# Patient Record
Sex: Female | Born: 1985 | Race: Black or African American | Hispanic: No | Marital: Single | Smoking: Never smoker
Health system: Southern US, Community
[De-identification: ages and names within clinical notes are randomized; demographics above are authoritative.]

---
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
## 2014-03-29 ENCOUNTER — Other Ambulatory Visit: Payer: Self-pay | Admitting: Family Medicine

## 2014-03-29 ENCOUNTER — Ambulatory Visit (INDEPENDENT_AMBULATORY_CARE_PROVIDER_SITE_OTHER): Payer: No Typology Code available for payment source | Admitting: Family Medicine

## 2014-03-29 VITALS — BP 102/64 | HR 62 | Temp 98.4°F | Resp 16 | Ht 61.5 in | Wt 106.4 lb

## 2014-03-29 DIAGNOSIS — R1084 Generalized abdominal pain: Secondary | ICD-10-CM

## 2014-03-29 DIAGNOSIS — R110 Nausea: Secondary | ICD-10-CM

## 2014-03-29 DIAGNOSIS — R142 Eructation: Secondary | ICD-10-CM

## 2014-03-29 DIAGNOSIS — F418 Other specified anxiety disorders: Secondary | ICD-10-CM

## 2014-03-29 DIAGNOSIS — D649 Anemia, unspecified: Secondary | ICD-10-CM

## 2014-03-29 DIAGNOSIS — R141 Gas pain: Secondary | ICD-10-CM

## 2014-03-29 DIAGNOSIS — R143 Flatulence: Secondary | ICD-10-CM

## 2014-03-29 DIAGNOSIS — F411 Generalized anxiety disorder: Secondary | ICD-10-CM

## 2014-03-29 LAB — POCT URINALYSIS DIPSTICK
Bilirubin, UA: NEGATIVE
Glucose, UA: NEGATIVE
Ketones, UA: 15
Leukocytes, UA: NEGATIVE
Nitrite, UA: NEGATIVE
Protein, UA: NEGATIVE
Spec Grav, UA: >=1.03
Urobilinogen, UA: 0.2
pH, UA: 5.5

## 2014-03-29 LAB — POCT UA - MICROSCOPIC ONLY
Casts, Ur, LPF, POC: NEGATIVE
Crystals, Ur, HPF, POC: NEGATIVE
Yeast, UA: NEGATIVE

## 2014-03-29 LAB — POCT CBC
Granulocyte percent: 26.8 %G — AB (ref 37–80)
HCT, POC: 32.1 % — AB (ref 37.7–47.9)
Hemoglobin: 9.8 g/dL — AB (ref 12.2–16.2)
Lymph, poc: 3.9 — AB (ref 0.6–3.4)
MCH, POC: 21.9 pg — AB (ref 27–31.2)
MCHC: 30.5 g/dL — AB (ref 31.8–35.4)
MCV: 71.7 fL — AB (ref 80–97)
MID (cbc): 0.4 (ref 0–0.9)
MPV: 11.8 fL (ref 0–99.8)
POC Granulocyte: 1.6 — AB (ref 2–6.9)
POC LYMPH PERCENT: 67.1 %L — AB (ref 10–50)
POC MID %: 6.1 %M (ref 0–12)
Platelet Count, POC: 348 10*3/uL (ref 142–424)
RBC: 4.48 M/uL (ref 4.04–5.48)
RDW, POC: 20.7 %
WBC: 5.8 10*3/uL (ref 4.6–10.2)

## 2014-03-29 LAB — POCT URINE PREGNANCY: Preg Test, Ur: NEGATIVE

## 2014-03-29 MED ORDER — ESCITALOPRAM OXALATE 10 MG PO TABS: 10.0000 mg | ORAL_TABLET | Freq: Every day | ORAL | Status: DC

## 2014-03-29 MED ORDER — ALPRAZOLAM 0.25 MG PO TABS: 0.2500 mg | ORAL_TABLET | Freq: Three times a day (TID) | ORAL | Status: DC | PRN

## 2014-03-29 NOTE — Patient Instructions (Addendum)
Restart Lexapro - once per day and if needed for stress/anxiety - xanax up to 3 times per day for now. This medicine can make you sedated. Recheck with me in the next 2 weeks to discuss this further.   For counseling - call one of the following: [facility] PedClaire Huprich: [phone] Karmen BongoAaron Stewart: [phone]  Try to decrease mint and other foods below that may upset stomach.  You should receive a call or letter about your lab results within the next week to 10 days.  We will refer you for ultrasound of abdomen and referral to gastroenterologist.  Bring back the test for blood in the stool, and recheck in the next 1 week for repeat blood count - sooner here or to the emergency room if any lightheadedness, dizziness or worsening abdominal pain.   FOODS OR DRINKS TO AVOID OR LIMIT  Smoking or chewing tobacco. Nicotine is one of the most potent stimulants to acid production in the gastrointestinal tract.  Caffeinated and decaffeinated coffee and black tea.  Regular or low-calorie carbonated beverages or energy drinks (caffeine-free carbonated beverages are allowed).   Strong spices, such as black pepper, white pepper, red pepper, cayenne, curry powder, and chili powder.  Peppermint or spearmint.  Chocolate.  High-fat foods, including meats and fried foods. Extra added fats including oils, butter, salad dressings, and nuts. Limit these to less than 8 tsp per day.  Fruits and vegetables if they are not tolerated, such as citrus fruits or tomatoes.  Alcohol. Any food that seems to aggravate your condition.  Return to the clinic or go to the nearest emergency room if any of your symptoms worsen or new symptoms occur.

## 2014-03-29 NOTE — Progress Notes (Addendum)
Subjective:    Patient ID: Kathy Velazquez, female    DOB: Feb 25, 1986, 28 y.o.   MRN: [id number] This chart was scribed for Kathy StaggersJeffrey Shaterrica Territo, MD by Evon Slackerrance Branch, ED Scribe. This Patient was seen in room 01 and the patients care was started at 7:55 PM  HPI  Kathy Velazquez is a 28 y.o. female States she has been having an upset stomach for past 2 weeks. She states that's she has associated nausea, appetite change, unexpected weight change, dark green stool.   She states that generally her stomach is upset through out the day. She states that her pain is usually in her lower right quadrant. She states that she has been anxious lately due to problems at work with getting along with her co-workers. She states that she works at a pharmacy as a Associate Professorpharmacy tech. She states that she feels as if her co-workers were making fun of her. She states that at work she has problems with the gas and people commenting on the odor. Pt states that she has a possible h/o of being lactose intolerant which is when she felt similar symptoms due to diary products.  She states that she has previously seen Dr. Rico SheehanKeeler at [facility] about 1 month ago prescribed her Dexlansoprazole for the gas and take Levsin as needed. She that these prescribed medications have not relived her of her symptoms. Pt states that she was initially placed on Nexium with no relief. Pt denies having any blood work done during her visits with Dr. Rico SheehanKeeler. Pt denies emesis, fever, diarrhea, suicidal ideation, or depression. Pt also states that's 2 years prior she was prescribed Xanax for generalized anxiety. She states that she drink alcohol socially. She states that she was drinking more frequently, at least 1 alcoholic beverage several times a week. She states that she stopped drinking alcohol when her stomach symptoms initially started. She also states since the onset of her symptoms she has made a change to her diet. Pt states that she has started  eating healthier and stopped drinking diet sodas.   Pt States that she is single and not sexually active. She states her LNMP was 03/16/14.  Pt also states that she eats mints often.,    There are no active problems to display for this patient.  History reviewed. No pertinent past medical history. History reviewed. No pertinent past surgical history. Prior to Admission medications   Medication Sig Start Date End Date Taking? Authorizing Provider  Dexlansoprazole 30 MG capsule Take 30 mg by mouth daily.   Yes Historical Provider, MD  hyoscyamine (LEVSIN, ANASPAZ) 0.125 MG tablet Take 0.125 mg by mouth every 4 (four) hours as needed.   Yes Historical Provider, MD   No Known Allergies History   Social History  . Marital Status: Single    Spouse Name: N/A    Number of Children: N/A  . Years of Education: N/A   Occupational History  . Not on file.   Social History Main Topics  . Smoking status: Never Smoker   . Smokeless tobacco: Not on file  . Alcohol Use: Not on file  . Drug Use: Not on file  . Sexual Activity: Not on file   Other Topics Concern  . Not on file   Social History Narrative  . No narrative on file   Review of Systems  Constitutional: Positive for appetite change and unexpected weight change.  Gastrointestinal: Positive for nausea and abdominal pain. Negative for vomiting and diarrhea.  Genitourinary: Negative for dysuria and hematuria.  Skin: Negative for rash.  Psychiatric/Behavioral: Negative for suicidal ideas. The patient is nervous/anxious.    Objective:   Filed Vitals:   03/29/14 1920  BP: 102/64  Pulse: 62  Temp: 98.4 F (36.9 C)  TempSrc: Oral  Resp: 16  Height: 5' 1.5" (1.562 m)  Weight: 106 lb 6.4 oz (48.263 kg)  SpO2: 100%     Physical Exam  Nursing note and vitals reviewed. Constitutional: She is oriented to person, place, and time. She appears well-developed and well-nourished. No distress.  HENT:  Head: Normocephalic and  atraumatic.  Eyes: EOM are normal.  Neck: Neck supple. No tracheal deviation present.  Cardiovascular: Normal rate, regular rhythm and normal heart sounds.  Exam reveals no gallop and no friction rub.   No murmur heard. Pulmonary/Chest: Effort normal. No respiratory distress.  Abdominal: Soft. There is no tenderness. There is no tenderness at McBurney's point and negative Murphy's sign.  No CVA tenderness  Musculoskeletal: Normal range of motion.  Neurological: She is alert and oriented to person, place, and time.  Skin: Skin is warm and dry. No rash noted.  Psychiatric: Her speech is normal. Judgment normal. Her affect is blunt. She is withdrawn. Cognition and memory are normal. She expresses no suicidal ideation.  Flat affect, initial poor eye contact improved some towards end of visit, denied depressed mood, admitted to some anhedonia, denies safety concerns at home or work or abuse    Results for orders placed in visit on 03/29/14  POCT CBC      Result Value Ref Range   WBC 5.8  4.6 - 10.2 K/uL   Lymph, poc 3.9 (*) 0.6 - 3.4   POC LYMPH PERCENT 67.1 (*) 10 - 50 %L   MID (cbc) 0.4  0 - 0.9   POC MID % 6.1  0 - 12 %M   POC Granulocyte 1.6 (*) 2 - 6.9   Granulocyte percent 26.8 (*) 37 - 80 %G   RBC 4.48  4.04 - 5.48 M/uL   Hemoglobin 9.8 (*) 12.2 - 16.2 g/dL   HCT, POC 40.9 (*) 81.1 - 47.9 %   MCV 71.7 (*) 80 - 97 fL   MCH, POC 21.9 (*) 27 - 31.2 pg   MCHC 30.5 (*) 31.8 - 35.4 g/dL   RDW, POC 91.4     Platelet Count, POC 348  142 - 424 K/uL   MPV 11.8  0 - 99.8 fL  POCT URINE PREGNANCY      Result Value Ref Range   Preg Test, Ur Negative    POCT UA - MICROSCOPIC ONLY      Result Value Ref Range   WBC, Ur, HPF, POC 2-3     RBC, urine, microscopic 0-2     Bacteria, U Microscopic small     Mucus, UA small     Epithelial cells, urine per micros 2-4     Crystals, Ur, HPF, POC neg     Casts, Ur, LPF, POC neg     Yeast, UA neg    POCT URINALYSIS DIPSTICK      Result Value Ref  Range   Color, UA yellow     Clarity, UA clear     Glucose, UA neg     Bilirubin, UA neg     Ketones, UA 15     Spec Grav, UA >=1.030     Blood, UA trace     pH, UA 5.5  Protein, UA neg     Urobilinogen, UA 0.2     Nitrite, UA neg     Leukocytes, UA Negative      Assessment & Plan:   ELISABETH STROM is a 28 y.o. female Generalized abdominal pain , Nausea alone, Excessive gas - Plan: POCT CBC, POCT urine pregnancy, Lipase, Comprehensive metabolic panel, Ambulatory referral to Gastroenterology, US Abdomen Complete  -DDX of gastritis, functional gallbladder d/o, IBD, IBS, PUD  Will check labs above, check U/S of abdomen, and refer to GI.  Continue PPI for now, and avoid foods below for now.   Situational anxiety, Generalized anxiety disorder - Plan: ALPRAZolam (XANAX) 0.25 MG tablet, escitalopram (LEXAPRO) 10 MG tablet  - work based stress/anxiety with anxiety in past.  Suspect some secondary anxiety with GI distress and concern over this impact at work. Restart Lexapro, xanax tid prnor now and to call counselor to discuss work concerns.   Anemia - Plan: IFOBT POC (occult bld, rslt in office), IBC panel   - ? Hx in past. Asx. Check iron panel, repeat CBC in next week, check hemosure, rtc precautions    Meds ordered this encounter  Medications  . Dexlansoprazole 30 MG capsule    Sig: Take 30 mg by mouth daily.  . hyoscyamine (LEVSIN, ANASPAZ) 0.125 MG tablet    Sig: Take 0.125 mg by mouth every 4 (four) hours as needed.  . ALPRAZolam (XANAX) 0.25 MG tablet    Sig: Take 1 tablet (0.25 mg total) by mouth 3 (three) times daily as needed for anxiety.    Dispense:  20 tablet    Refill:  0  . escitalopram (LEXAPRO) 10 MG tablet    Sig: Take 1 tablet (10 mg total) by mouth daily.    Dispense:  30 tablet    Refill:  1   Patient Instructions  Restart Lexapro - once per day and if needed for stress/anxiety - xanax up to 3 times per day for now. This medicine can make you sedated.  Recheck with me in the next 2 weeks to discuss this further.   For counseling - call one of the following: [facility] Ped: [phone] Karmen Bongo: [phone]  Try to decrease mint and other foods below that may upset stomach.  You should receive a call or letter about your lab results within the next week to 10 days.  We will refer you for ultrasound of abdomen and referral to gastroenterologist.  Bring back the test for blood in the stool, and recheck in the next 1 week for repeat blood count - sooner here or to the emergency room if any lightheadedness, dizziness or worsening abdominal pain.   FOODS OR DRINKS TO AVOID OR LIMIT  Smoking or chewing tobacco. Nicotine is one of the most potent stimulants to acid production in the gastrointestinal tract.  Caffeinated and decaffeinated coffee and black tea.  Regular or low-calorie carbonated beverages or energy drinks (caffeine-free carbonated beverages are allowed).   Strong spices, such as black pepper, white pepper, red pepper, cayenne, curry powder, and chili powder.  Peppermint or spearmint.  Chocolate.  High-fat foods, including meats and fried foods. Extra added fats including oils, butter, salad dressings, and nuts. Limit these to less than 8 tsp per day.  Fruits and vegetables if they are not tolerated, such as citrus fruits or tomatoes.  Alcohol. Any food that seems to aggravate your condition.  Return to the clinic or go to the nearest emergency room if any of your  symptoms worsen or new symptoms occur.      I personally performed the services described in this documentation, which was scribed in my presence. The recorded information has been reviewed and considered, and addended by me as needed.

## 2014-03-30 LAB — LIPASE: Lipase: 10 U/L (ref 0–75)

## 2014-03-30 LAB — COMPREHENSIVE METABOLIC PANEL
ALT: 8 U/L (ref 0–35)
AST: 16 U/L (ref 0–37)
Albumin: 4.7 g/dL (ref 3.5–5.2)
Alkaline Phosphatase: 43 U/L (ref 39–117)
BUN: 6 mg/dL (ref 6–23)
CO2: 25 mEq/L (ref 19–32)
Calcium: 9.5 mg/dL (ref 8.4–10.5)
Chloride: 106 mEq/L (ref 96–112)
Creat: 0.57 mg/dL (ref 0.50–1.10)
Glucose, Bld: 80 mg/dL (ref 70–99)
Potassium: 4 mEq/L (ref 3.5–5.3)
Sodium: 137 mEq/L (ref 135–145)
Total Bilirubin: 0.7 mg/dL (ref 0.2–1.2)
Total Protein: 7.3 g/dL (ref 6.0–8.3)

## 2014-03-30 LAB — IRON: Iron: 13 ug/dL — ABNORMAL LOW (ref 42–145)

## 2014-03-30 LAB — IBC PANEL
%SAT: 3 % — ABNORMAL LOW (ref 20–55)
TIBC: 475 ug/dL — AB (ref 250–470)
UIBC: 462 ug/dL — ABNORMAL HIGH (ref 125–400)

## 2014-04-03 ENCOUNTER — Encounter: Payer: Self-pay | Admitting: Internal Medicine

## 2014-04-06 LAB — IFOBT (OCCULT BLOOD): IFOBT: POSITIVE

## 2014-04-06 NOTE — Addendum Note (Signed)
Addended by: Bronson CurbPOE, Chriselda Leppert C on: 04/06/2014 08:06 PM   Modules accepted: Orders

## 2014-04-11 ENCOUNTER — Ambulatory Visit (INDEPENDENT_AMBULATORY_CARE_PROVIDER_SITE_OTHER): Payer: No Typology Code available for payment source | Admitting: Family Medicine

## 2014-04-11 ENCOUNTER — Ambulatory Visit
Admission: RE | Admit: 2014-04-11 | Discharge: 2014-04-11 | Disposition: A | Discharge status: Home / Self Care | Payer: No Typology Code available for payment source | Source: Ambulatory Visit | Attending: Family Medicine | Admitting: Family Medicine

## 2014-04-11 VITALS — BP 110/70 | HR 87 | Temp 99.2°F | Resp 16 | Ht 63.0 in | Wt 103.0 lb

## 2014-04-11 DIAGNOSIS — F411 Generalized anxiety disorder: Secondary | ICD-10-CM

## 2014-04-11 DIAGNOSIS — R143 Flatulence: Secondary | ICD-10-CM

## 2014-04-11 DIAGNOSIS — R109 Unspecified abdominal pain: Secondary | ICD-10-CM

## 2014-04-11 DIAGNOSIS — D649 Anemia, unspecified: Secondary | ICD-10-CM

## 2014-04-11 DIAGNOSIS — R1084 Generalized abdominal pain: Secondary | ICD-10-CM

## 2014-04-11 DIAGNOSIS — R110 Nausea: Secondary | ICD-10-CM

## 2014-04-11 LAB — POCT CBC
Granulocyte percent: 36.4 %G — AB (ref 37–80)
HCT, POC: 33.8 % — AB (ref 37.7–47.9)
Hemoglobin: 10 g/dL — AB (ref 12.2–16.2)
Lymph, poc: 2.7 (ref 0.6–3.4)
MCH, POC: 21.4 pg — AB (ref 27–31.2)
MCHC: 29.6 g/dL — AB (ref 31.8–35.4)
MCV: 72.3 fL — AB (ref 80–97)
MID (CBC): 0.5 (ref 0–0.9)
POC Granulocyte: 1.8 — AB (ref 2–6.9)
POC LYMPH %: 54.3 % — AB (ref 10–50)
POC MID %: 9.3 % (ref 0–12)
Platelet Count, POC: 250 10*3/uL (ref 142–424)
RBC: 4.67 M/uL (ref 4.04–5.48)
RDW, POC: 19.2 %
WBC: 5 10*3/uL (ref 4.6–10.2)

## 2014-04-11 NOTE — Patient Instructions (Signed)
Hemoglobin stable.  Start iron over the counter (325mg ) once per day.  If abdominal pain starts to worsen again - return for recheck, but if continues to be improved - can follow up with gastroenterologist as scheduled. Call counselor as discussed - let me know if you need other names, and no other med changes for now.  Recheck in 3-4 weeks. Return to the clinic or go to the nearest emergency room if any of your symptoms worsen or new symptoms occur. Abdominal Pain, Adult Many things can cause abdominal pain. Usually, abdominal pain is not caused by a disease and will improve without treatment. It can often be observed and treated at home. Your health care provider will do a physical exam and possibly order blood tests and X-rays to help determine the seriousness of your pain. However, in many cases, more time must pass before a clear cause of the pain can be found. Before that point, your health care provider may not know if you need more testing or further treatment. HOME CARE INSTRUCTIONS  Monitor your abdominal pain for any changes. The following actions may help to alleviate any discomfort you are experiencing:  Only take over-the-counter or prescription medicines as directed by your health care provider.  Do not take laxatives unless directed to do so by your health care provider.  Try a clear liquid diet (broth, tea, or water) as directed by your health care provider. Slowly move to a bland diet as tolerated. SEEK MEDICAL CARE IF:  You have unexplained abdominal pain.  You have abdominal pain associated with nausea or diarrhea.  You have pain when you urinate or have a bowel movement.  You experience abdominal pain that wakes you in the night.  You have abdominal pain that is worsened or improved by eating food.  You have abdominal pain that is worsened with eating fatty foods. SEEK IMMEDIATE MEDICAL CARE IF:   Your pain does not go away within 2 hours.  You have a fever.  You  keep throwing up (vomiting).  Your pain is felt only in portions of the abdomen, such as the right side or the left lower portion of the abdomen.  You pass bloody or black tarry stools. MAKE SURE YOU:  Understand these instructions.   Will watch your condition.   Will get help right away if you are not doing well or get worse.  Document Released: 08/13/2005 Document Revised: 08/24/2013 Document Reviewed: 07/13/2013 [facility] Patient Information 2014 [place], [place].

## 2014-04-11 NOTE — Progress Notes (Addendum)
Subjective:    Patient ID: Kathy Velazquez, female    DOB: 1986/09/16, 28 y.o.   MRN: [id number] This chart was scribed for [name], MD by Valera Castle, ED Scribe. This patient was seen in room 08 and the patient's care was started at 1:00 PM.  Chief Complaint  Patient presents with  . Follow-up    abdominal pain   HPI Kathy Velazquez is a 28 y.o. female Follow up of abdominal pain. Initially seen 13 days ago. 2 weeks of abdomianl pain at that time. Had been seen b other providers prescribed Dexilant and Levsin as needed. Noted to be anemic with hemoglobin was 9.8 at last visit. Hemosure was positive and iron panel indicated iron deficiency She had complete abdominal US this morning. No acute abnormally noted. Lipase and CMP were normal. She was referred to GI appointment is 07/14. Also noted situational anxiety with GAD at last visit started Lexapro 10mg  a day and Xanax as needed. Phone was number given for counseling.   Pt reports feeling better in general. She reports her stomach was upset 2 days ago, but no issues since then. She reports still having gas issues, about the same amount as before with an associated sweet odor. She denies true abdominal cramping, but reports more of a sour feeling in her stomach.   She takes her Levsin usually once a day with lunch. She is taking a multivitamin daily that includes iron. Lexapro had given her dizzy spells at first, but she reports feeling better recently. She takes Xanax as needed, only 3 times since being prescribed.   She reports her work situation has changed. She talked to her supervisor, stating initially working after that was much worse, but lately it has improved significantly since then.  Her LNMP was 2 weeks ago. She states her periods will last 4-5 days with a moderate amount of bleeding, denies any menstrual changes.. She denies fever, vomiting, decreased appetite, black or tarry stool, dysuria, and any other associated  symptoms.  PCP - No PCP Per Patient  There are no active problems to display for this patient.  History reviewed. No pertinent past medical history. History reviewed. No pertinent past surgical history. No Known Allergies Prior to Admission medications   Medication Sig Start Date End Date Taking? Authorizing Provider  ALPRAZolam (XANAX) 0.25 MG tablet Take 1 tablet (0.25 mg total) by mouth 3 (three) times daily as needed for anxiety. 03/29/14  Yes Shade Flood, MD  Dexlansoprazole 30 MG capsule Take 30 mg by mouth daily.   Yes Historical Provider, MD  escitalopram (LEXAPRO) 10 MG tablet Take 1 tablet (10 mg total) by mouth daily. 03/29/14  Yes Shade Flood, MD  hyoscyamine (LEVSIN, ANASPAZ) 0.125 MG tablet Take 0.125 mg by mouth every 4 (four) hours as needed.   Yes Historical Provider, MD   Review of Systems  Constitutional: Negative for fever and appetite change.  Gastrointestinal: Positive for nausea and abdominal pain (sour feeling in stomach). Negative for vomiting and blood in stool.       Positive for gas with sweet odor.   Genitourinary: Negative for dysuria, difficulty urinating and menstrual problem.      Objective:   Physical Exam  Nursing note and vitals reviewed. Constitutional: She is oriented to person, place, and time. She appears well-developed and well-nourished. No distress.  HENT:  Head: Normocephalic and atraumatic.  Eyes: Conjunctivae and EOM are normal. Pupils are equal, round, and reactive to light.  Neck: Neck supple. Carotid bruit is not present.  Cardiovascular: Normal rate, regular rhythm, normal heart sounds and intact distal pulses.  Exam reveals no gallop and no friction rub.   No murmur heard. Pulmonary/Chest: Effort normal and breath sounds normal. No respiratory distress. She has no wheezes. She has no rales.  Abdominal: Soft. Bowel sounds are normal. She exhibits no distension and no pulsatile midline mass. There is no tenderness. There is no  rebound, no guarding and no CVA tenderness.  Musculoskeletal: Normal range of motion.  Neurological: She is alert and oriented to person, place, and time.  Skin: Skin is warm and dry.  Psychiatric: She has a normal mood and affect. Her behavior is normal.   BP 110/70  Pulse 87  Temp(Src) 99.2 F (37.3 C) (Oral)  Resp 16  Ht 5\' 3"  (1.6 m)  Wt 103 lb (46.72 kg)  BMI 18.25 kg/m2  SpO2 99%  LMP 03/30/2014  Results for orders placed in visit on 04/11/14  POCT CBC      Result Value Ref Range   WBC 5.0  4.6 - 10.2 K/uL   Lymph, poc 2.7  0.6 - 3.4   POC LYMPH PERCENT 54.3 (*) 10 - 50 %L   MID (cbc) 0.5  0 - 0.9   POC MID % 9.3  0 - 12 %M   POC Granulocyte 1.8 (*) 2 - 6.9   Granulocyte percent 36.4 (*) 37 - 80 %G   RBC 4.67  4.04 - 5.48 M/uL   Hemoglobin 10.0 (*) 12.2 - 16.2 g/dL   HCT, POC 16.1 (*) 09.6 - 47.9 %   MCV 72.3 (*) 80 - 97 fL   MCH, POC 21.4 (*) 27 - 31.2 pg   MCHC 29.6 (*) 31.8 - 35.4 g/dL   RDW, POC 04.5     Platelet Count, POC 250  142 - 424 K/uL   MPV    0 - 99.8 fL       Assessment & Plan:   Kathy Velazquez is a 28 y.o. female Pain in the abdomen , Anemia  - improved pain.  Stable anemia. DDX of gastritis/IBD/IBS, but heme positive stool - IBD possible.  Sx's improved, so can contineu same regimen with PPI, levsin if needed for now, start iron QD, and keep scheduled GI follow up, but if pain worsens again - rtc as may need CT or earlier GI eval.   GAD (generalized anxiety disorder)  -improved. Cont lexapro QD, xanax prn only. Call counselor as discussed. rtc for recheck in 1 month.    No orders of the defined types were placed in this encounter.   Patient Instructions  Hemoglobin stable.  Start iron over the counter (325mg ) once per day.  If abdominal pain starts to worsen again - return for recheck, but if continues to be improved - can follow up with gastroenterologist as scheduled. Call counselor as discussed - let me know if you need other names, and  no other med changes for now.  Recheck in 3-4 weeks. Return to the clinic or go to the nearest emergency room if any of your symptoms worsen or new symptoms occur. Abdominal Pain, Adult Many things can cause abdominal pain. Usually, abdominal pain is not caused by a disease and will improve without treatment. It can often be observed and treated at home. Your health care provider will do a physical exam and possibly order blood tests and X-rays to help determine the seriousness of your pain. However,  in many cases, more time must pass before a clear cause of the pain can be found. Before that point, your health care provider may not know if you need more testing or further treatment. HOME CARE INSTRUCTIONS  Monitor your abdominal pain for any changes. The following actions may help to alleviate any discomfort you are experiencing:  Only take over-the-counter or prescription medicines as directed by your health care provider.  Do not take laxatives unless directed to do so by your health care provider.  Try a clear liquid diet (broth, tea, or water) as directed by your health care provider. Slowly move to a bland diet as tolerated. SEEK MEDICAL CARE IF:  You have unexplained abdominal pain.  You have abdominal pain associated with nausea or diarrhea.  You have pain when you urinate or have a bowel movement.  You experience abdominal pain that wakes you in the night.  You have abdominal pain that is worsened or improved by eating food.  You have abdominal pain that is worsened with eating fatty foods. SEEK IMMEDIATE MEDICAL CARE IF:   Your pain does not go away within 2 hours.  You have a fever.  You keep throwing up (vomiting).  Your pain is felt only in portions of the abdomen, such as the right side or the left lower portion of the abdomen.  You pass bloody or black tarry stools. MAKE SURE YOU:  Understand these instructions.   Will watch your condition.   Will get help  right away if you are not doing well or get worse.  Document Released: 08/13/2005 Document Revised: 08/24/2013 Document Reviewed: 07/13/2013 [facility] Patient Information 2014 [place].        I personally performed the services described in this documentation, which was scribed in my presence. The recorded information has been reviewed and considered, and addended by me as needed.

## 2014-04-17 ENCOUNTER — Telehealth: Payer: Self-pay

## 2014-04-17 NOTE — Telephone Encounter (Signed)
Dexlansoprazole 30 MG capsule Pharmacy is needing pre authorization, or to use another drug that her insurance will cover

## 2014-04-18 NOTE — Telephone Encounter (Signed)
LMOM for pt to CB. Has she been Dxd w/GERD previously? What other medications has she tried for this previously?

## 2014-04-19 NOTE — Telephone Encounter (Signed)
LMOM to CB. 

## 2014-04-21 NOTE — Telephone Encounter (Signed)
Pt stated that she hasn't ever been told she has an actual Dx of GERD, but has an appt w/GI sch for July. She has previously tried Nexium, mylanta and pepto-Bismol. Dr Neva Seat can I use a Dx of GERD for prior auth, or just abdominal pain?

## 2014-04-26 NOTE — Telephone Encounter (Signed)
I do not have a diagnosis of GERD, just abd pain, and was improving as of last ov, but minimal change in sx's with dexilant initially as well.  Can try other PPI if easier to get this covered until seen by GI.  If worsening of pain - including any change or worsening on different PPI - will need to be seen sooner by GI.  If worsens - RTC.

## 2014-04-27 NOTE — Telephone Encounter (Signed)
Completed PA form on covermymeds. 

## 2014-05-02 NOTE — Telephone Encounter (Signed)
Can try omeprazole 20mg  qd, if not improved in 1-2 weeks, let me know. If worse, RTC.

## 2014-05-02 NOTE — Telephone Encounter (Signed)
Called [facility] had not heard decision and was advised PA denied bc pt has not tried/failed all of preferred: prilosec/omeprazole, prevacid and pantoprazole. Dr Neva SeatGreene, which would you like to Rx for pt (her GI appt is 05/30/14)

## 2014-05-04 MED ORDER — OMEPRAZOLE 20 MG PO CPDR: 20.0000 mg | DELAYED_RELEASE_CAPSULE | Freq: Every day | ORAL | Status: DC

## 2014-05-04 NOTE — Telephone Encounter (Signed)
[facility]

## 2014-05-04 NOTE — Addendum Note (Signed)
Addended by: Sheppard PlumberBRIGGS, BARBARA A on: 05/04/2014 03:35 PM   Modules accepted: Orders

## 2014-05-17 NOTE — Telephone Encounter (Signed)
Pt never CB so I called again and left details about new Rx and instr's from Dr Neva SeatGreene.

## 2014-05-30 ENCOUNTER — Ambulatory Visit: Payer: Self-pay | Admitting: Internal Medicine

## 2014-07-01 ENCOUNTER — Other Ambulatory Visit: Payer: Self-pay | Admitting: Family Medicine

## 2014-07-01 ENCOUNTER — Other Ambulatory Visit: Payer: Self-pay

## 2014-07-01 DIAGNOSIS — F418 Other specified anxiety disorders: Secondary | ICD-10-CM

## 2014-07-01 DIAGNOSIS — F411 Generalized anxiety disorder: Secondary | ICD-10-CM

## 2014-07-01 NOTE — Telephone Encounter (Signed)
The patient called about receiving refill authorization for escitalopram (LEXAPRO) 10 MG tablet and ALPRAZolam (XANAX) 0.25 MG tablet.  I informed her that she may need an office visit if those particular medications require a follow-up with a provider.  Please advise and contact the patient once the medication is sent to the pharmacy or if an appointment is needed. Thank you.  The patient's CB# is [phone].

## 2014-07-03 ENCOUNTER — Other Ambulatory Visit: Payer: Self-pay | Admitting: *Deleted

## 2014-07-03 MED ORDER — ALPRAZOLAM 0.25 MG PO TABS: 0.2500 mg | ORAL_TABLET | Freq: Three times a day (TID) | ORAL | Status: DC | PRN

## 2014-07-03 NOTE — Telephone Encounter (Signed)
Dr Neva SeatGreene, I gave pt 1 mos RF of Lexapro w/note to RTC for follow up for more RFs. Do you want to give a RF of alprazolam or need to see pt back first? Pended w/note to return

## 2014-07-03 NOTE — Telephone Encounter (Signed)
IN may - she was instructed to follow up in 1 month. Needs to return. Agree with refill Lexapro and xanax once -  until that ov (no further refills without OV).

## 2014-07-04 NOTE — Telephone Encounter (Signed)
Faxed

## 2014-07-31 ENCOUNTER — Ambulatory Visit (INDEPENDENT_AMBULATORY_CARE_PROVIDER_SITE_OTHER): Payer: No Typology Code available for payment source | Admitting: Emergency Medicine

## 2014-07-31 VITALS — BP 100/60 | HR 77 | Temp 98.3°F | Resp 16 | Ht 62.5 in | Wt 114.0 lb

## 2014-07-31 DIAGNOSIS — F411 Generalized anxiety disorder: Secondary | ICD-10-CM

## 2014-07-31 DIAGNOSIS — R196 Halitosis: Secondary | ICD-10-CM

## 2014-07-31 DIAGNOSIS — R6889 Other general symptoms and signs: Secondary | ICD-10-CM

## 2014-07-31 MED ORDER — ESCITALOPRAM OXALATE 20 MG PO TABS: 20.0000 mg | ORAL_TABLET | Freq: Every day | ORAL | Status: DC

## 2014-07-31 NOTE — Progress Notes (Addendum)
[facility] [address] [place] [address] [phone]  Date:  07/31/2014   Name:  Kathy Velazquez   DOB:  22-Jan-1986   MRN:  [id number]  PCP:  No PCP Per Patient    Chief Complaint: Sore Throat and Medication Problem   History of Present Illness:  Kathy Velazquez is a 28 y.o. very pleasant female patient who presents with the following:  Concerned that she may have an infection of either bacterial or fungal origin in her mouth associated with tongue coating. Has no cough or coryza, fever or chills. Has a sore throat with no difficulty swallowing.   Is under treatment for anxiety disorder with lexapro 10 since June and is concerned that it has not improved her symptoms of anxiety. She is eating and sleeping normally.  Has anxiety and palpitations at work where the majority of her symptoms arise.  No improvement with over the counter medications or other home remedies.  Denies other complaint or health concern today.   There are no active problems to display for this patient.   History reviewed. No pertinent past medical history.  History reviewed. No pertinent past surgical history.  History  Substance Use Topics  . Smoking status: Never Smoker   . Smokeless tobacco: Not on file  . Alcohol Use: Not on file    History reviewed. No pertinent family history.  No Known Allergies  Medication list has been reviewed and updated.  Current Outpatient Prescriptions on File Prior to Visit  Medication Sig Dispense Refill  . ALPRAZolam (XANAX) 0.25 MG tablet Take 1 tablet (0.25 mg total) by mouth 3 (three) times daily as needed for anxiety. PATIENT NEEDS OFFICE VISIT FOR ADDITIONAL REFILLS - overdue for follow up  20 tablet  0  . escitalopram (LEXAPRO) 10 MG tablet Take 1 tablet (10 mg total) by mouth daily. PATIENT NEEDS OFFICE VISIT FOR ADDITIONAL REFILLS - overdue for follow up  30 tablet  0  . Dexlansoprazole 30 MG capsule Take 30 mg by mouth daily.       . hyoscyamine (LEVSIN, ANASPAZ) 0.125 MG tablet Take 0.125 mg by mouth every 4 (four) hours as needed.      Marland Kitchen omeprazole (PRILOSEC) 20 MG capsule Take 1 capsule (20 mg total) by mouth daily.  30 capsule  0   No current facility-administered medications on file prior to visit.    Review of Systems:  As per HPI, otherwise negative.    Physical Examination: Filed Vitals:   07/31/14 1710  BP: 100/60  Pulse: 77  Temp: 98.3 F (36.8 C)  Resp: 16   Filed Vitals:   07/31/14 1710  Height: 5' 2.5" (1.588 m)  Weight: 114 lb (51.71 kg)   Body mass index is 20.51 kg/(m^2). Ideal Body Weight: Weight in (lb) to have BMI = 25: 138.6  GEN: WDWN, NAD, Non-toxic, A & O x 3 HEENT: Atraumatic, Normocephalic. Neck supple. No masses, No LAD. Ears and Nose: No external deformity. CV: RRR, No M/G/R. No JVD. No thrill. No extra heart sounds. PULM: CTA B, no wheezes, crackles, rhonchi. No retractions. No resp. distress. No accessory muscle use. ABD: S, NT, ND, +BS. No rebound. No HSM. EXTR: No c/c/e NEURO Normal gait.  PSYCH: Normally interactive. Conversant. Not depressed or anxious appearing.  Calm demeanor.    Assessment and Plan: Anxiety Halitosis Double dose lexapro and follow up in one month if no change   Signed,  Phillips Odor, MD

## 2014-07-31 NOTE — Patient Instructions (Signed)
Halitosis Halitosis is the medical term for bad breath. Halitosis may be caused by poor oral hygiene, dental problems, sinus infection, and foods like onion and garlic. Medications that make your mouth dry can also lead to bad breath. In children thumb sucking may also be a cause. Sour breath in the morning from reduced saliva flow is normal. This sour breath should go away after eating, drinking or brushing. Poor dental care from infrequent brushing and flossing is the most common reason for bad breath. Halitosis from tooth decay and gum or sinus infections requires ongoing treatment by a dentist or doctor. A dental cleaning and filling cavities may also be necessary.  Mild cases of halitosis will improve with good dental hygiene. Brush twice a day with fluoride toothpaste and use floss once a day. Ask your dentist or dental assistant to show you proper brushing and flossing technique. This is the most important part of treatment. Brushing your tongue may also help improve your breath. Mouth washes and breath mints offer only temporary benefit. Bad breath in children from thumb sucking will improve when they give up the habit.  See your caregiver or dentist if you need further dental care or have signs of an infection in your mouth. Document Released: 12/11/2004 Document Revised: 01/26/2012 Document Reviewed: 06/01/2009 ExitCare Patient Information 2015 [employer]. This information is not intended to replace advice given to you by your health care provider. Make sure you discuss any questions you have with your health care provider.  

## 2015-06-05 IMAGING — US US ABDOMEN COMPLETE
1 series · 14 of 25 positions shown · non-contrast
Comparison: None.

CLINICAL DATA: Right upper quadrant pain

EXAM:
ULTRASOUND ABDOMEN COMPLETE

[Series 1: us abdomen complete · 0.17mm/px · 14 of 77 slices shown]
[im 1/77]
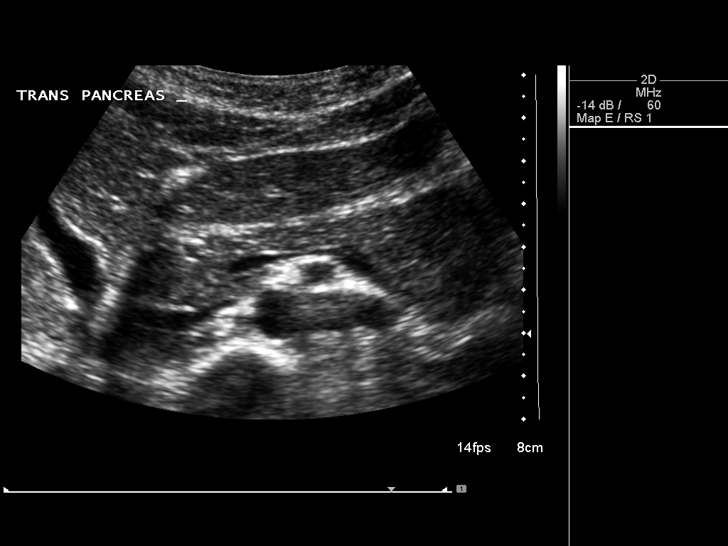
[im 7/77]
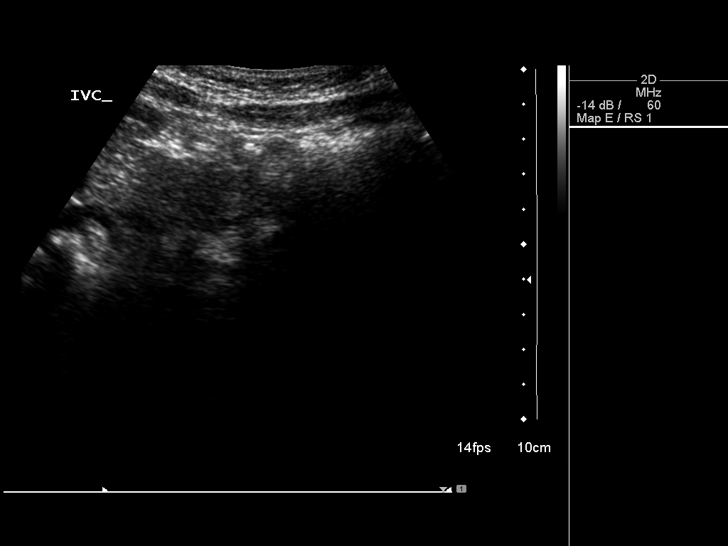
[im 13/77]
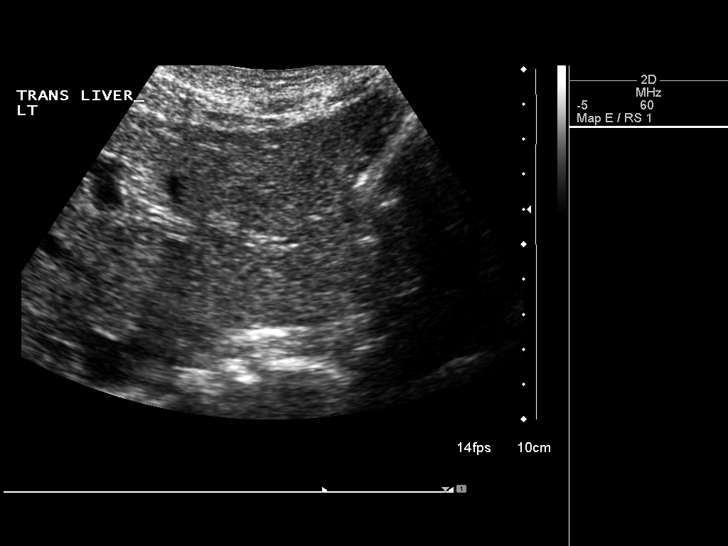
[im 20/77]
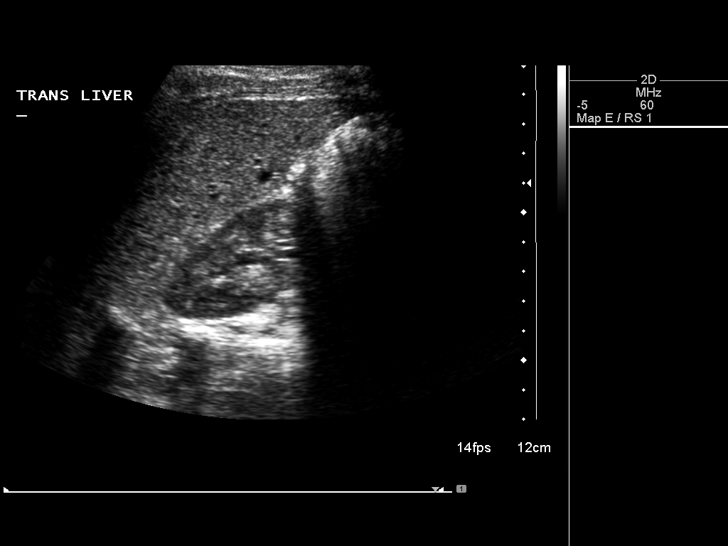
[im 26/77]
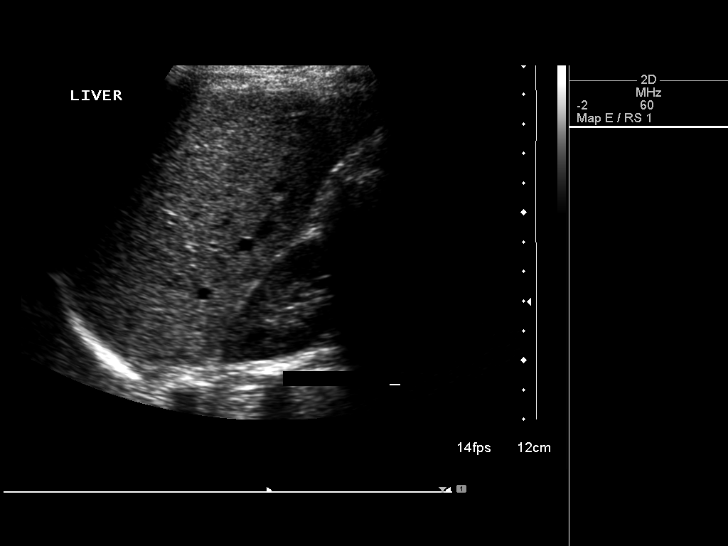
[im 29/77]
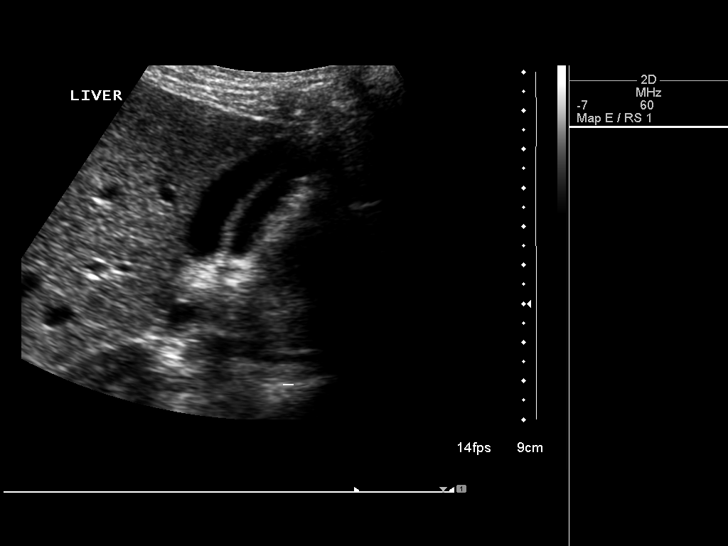
[im 35/77]
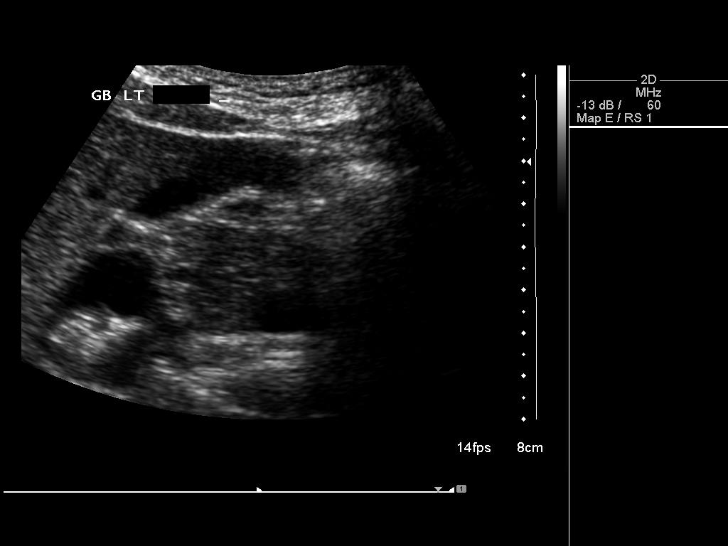
[im 42/77]
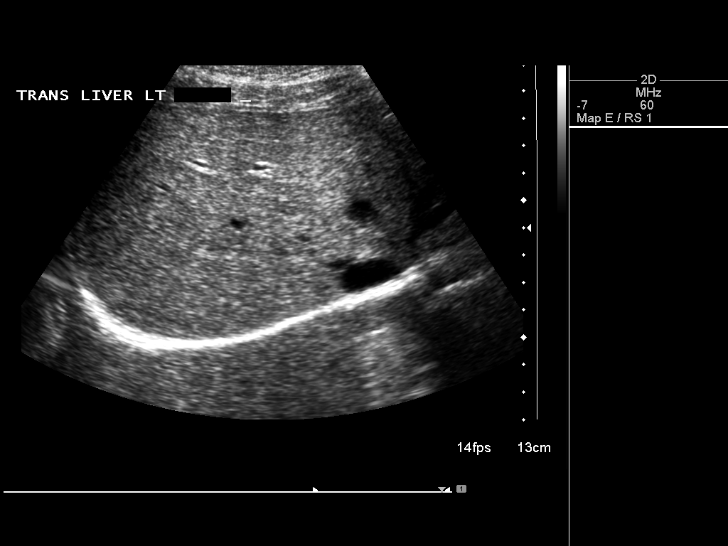
[im 48/77]
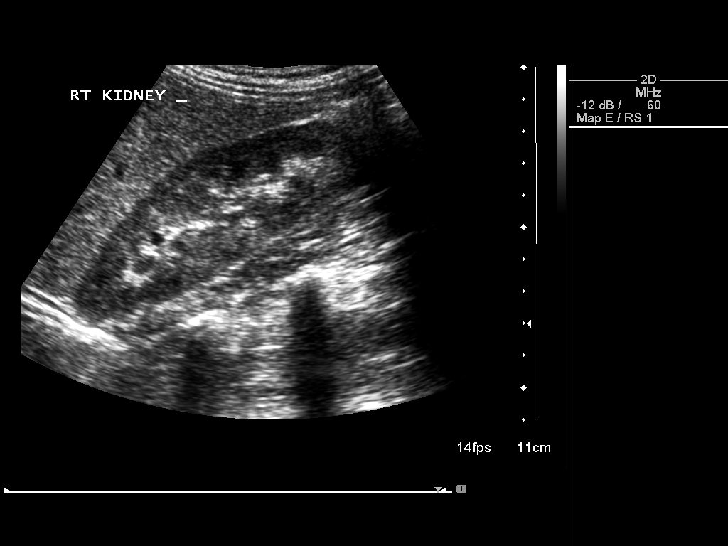
[im 51/77]
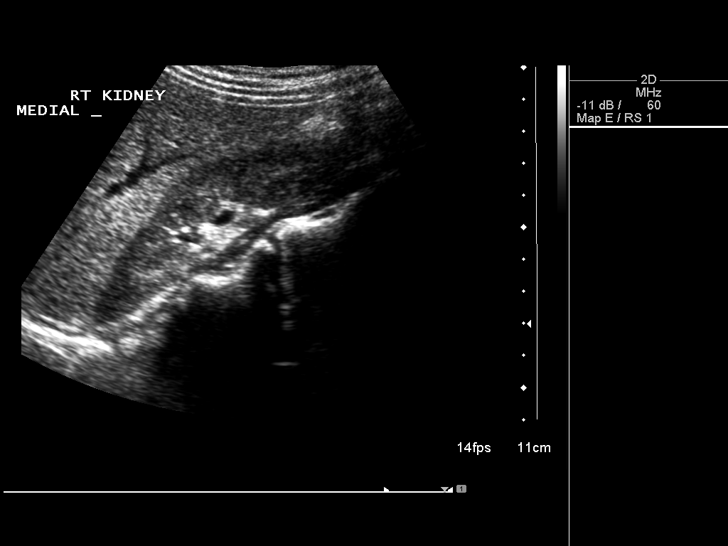
[im 58/77]
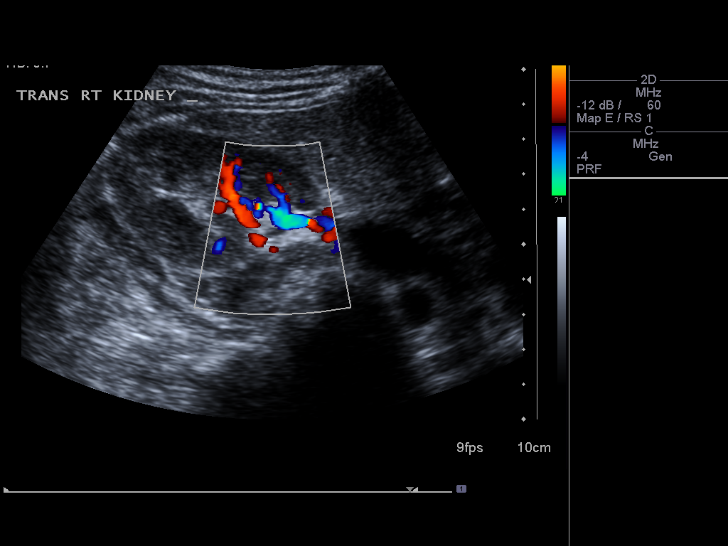
[im 64/77]
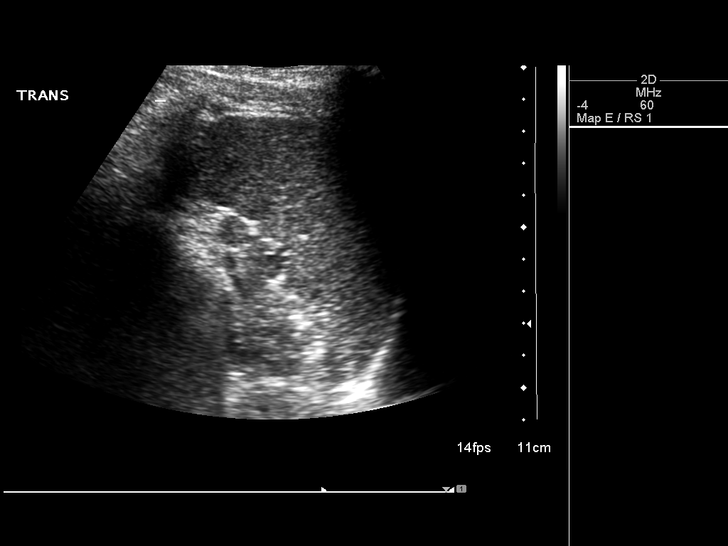
[im 70/77]
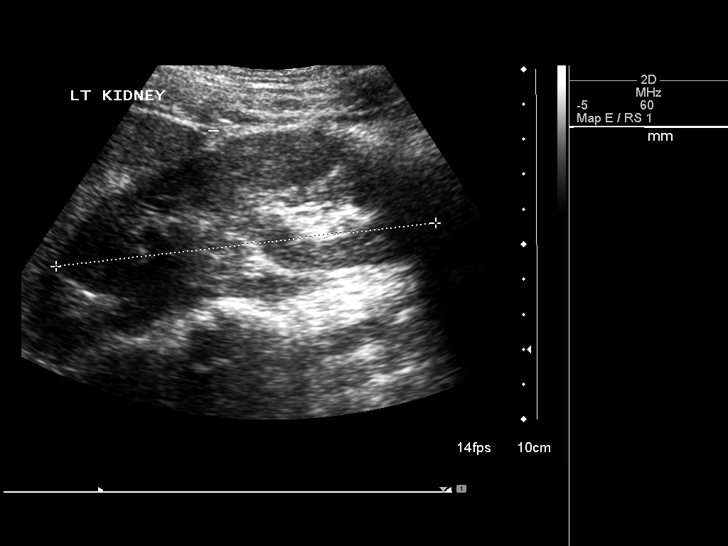
[im 77/77]
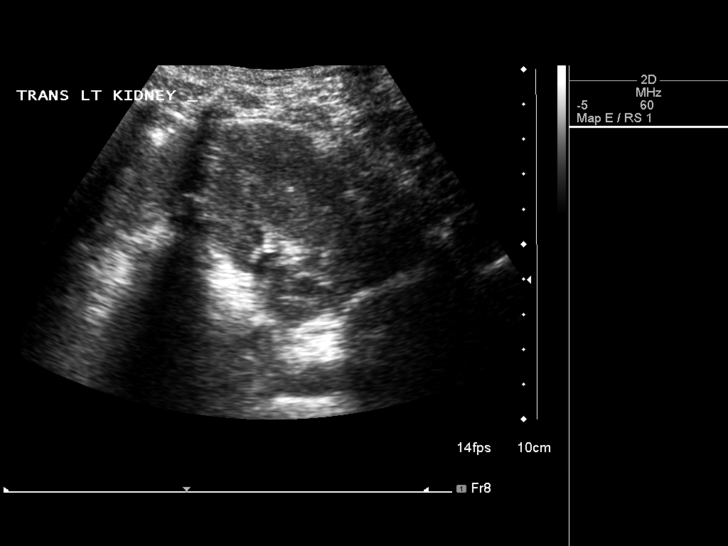

[14 of 25 positions shown; findings below may reference images not displayed]

FINDINGS: Gallbladder:

No gallstones or wall thickening visualized. No sonographic Murphy
sign noted.

Common bile duct:

Diameter: 2 mm

Liver:

No focal lesion identified. Within normal limits in parenchymal
echogenicity.

IVC:

No abnormality visualized.

Pancreas:

Visualized portion unremarkable.

Spleen:

Within normal limits although the small accessory splenule is noted
adjacent to the spleen.

Right Kidney:

Length: 10.9 cm.. Echogenicity within normal limits. No mass or
hydronephrosis visualized.

Left Kidney:

Length: 10.9 cm.. Echogenicity within normal limits. No mass or
hydronephrosis visualized.

Abdominal aorta:

No aneurysm visualized.

Other findings:

None.
IMPRESSION: No acute abnormality noted.

## 2016-02-11 ENCOUNTER — Ambulatory Visit (INDEPENDENT_AMBULATORY_CARE_PROVIDER_SITE_OTHER): Payer: BLUE CROSS/BLUE SHIELD | Admitting: Physician Assistant

## 2016-02-11 VITALS — BP 118/62 | HR 76 | Temp 99.4°F | Resp 16 | Ht 64.0 in | Wt 108.8 lb

## 2016-02-11 DIAGNOSIS — H6691 Otitis media, unspecified, right ear: Secondary | ICD-10-CM | POA: Diagnosis not present

## 2016-02-11 DIAGNOSIS — H6001 Abscess of right external ear: Secondary | ICD-10-CM

## 2016-02-11 MED ORDER — OFLOXACIN 0.3 % OT SOLN: 5.0000 [drp] | Freq: Every day | OTIC | Status: DC

## 2016-02-11 MED ORDER — ANTIPYRINE-BENZOCAINE 5.4-1.4 % OT SOLN: 3.0000 [drp] | OTIC | Status: DC | PRN

## 2016-02-11 MED ORDER — SULFAMETHOXAZOLE-TRIMETHOPRIM 800-160 MG PO TABS: 1.0000 | ORAL_TABLET | Freq: Two times a day (BID) | ORAL | Status: DC

## 2016-02-11 NOTE — Patient Instructions (Addendum)
     IF you received an x-ray today, you will receive an invoice from [facility]. Please contact [facility] at [phone] with questions or concerns regarding your invoice.   IF you received labwork today, you will receive an invoice from [facility]. Please contact [facility] at [phone] with questions or concerns regarding your invoice.   Our billing staff will not be able to assist you with questions regarding bills from these companies.  You will be contacted with the lab results as soon as they are available. The fastest way to get your results is to activate your My Chart account. Instructions are located on the last page of this paperwork. If you have not heard from us regarding the results in 2 weeks, please contact this office.   Please use medication as prescribed. I would like you to contact me if no relief in the next 48-72 hours, as we will refer to ear nose and throat doctor. You can take tylenol or ibuprofen for the pain. Place a warm compress at the ear to help draw the abscess out.    Abscess An abscess (boil or furuncle) is an infected area on or under the skin. This area is filled with yellowish-white fluid (pus) and other material (debris). HOME CARE   Only take medicines as told by your doctor.  If you were given antibiotic medicine, take it as directed. Finish the medicine even if you start to feel better.  If gauze is used, follow your doctor's directions for changing the gauze.  To avoid spreading the infection:  Keep your abscess covered with a bandage.  Wash your hands well.  Do not share personal care items, towels, or whirlpools with others.  Avoid skin contact with others.  Keep your skin and clothes clean around the abscess.  Keep all doctor visits as told. GET HELP RIGHT AWAY IF:   You have more pain, puffiness (swelling), or redness in the wound site.  You have more fluid or blood coming from  the wound site.  You have muscle aches, chills, or you feel sick.  You have a fever. MAKE SURE YOU:   Understand these instructions.  Will watch your condition.  Will get help right away if you are not doing well or get worse.   This information is not intended to replace advice given to you by your health care provider. Make sure you discuss any questions you have with your health care provider.   Document Released: 04/21/2008 Document Revised: 05/04/2012 Document Reviewed: 01/17/2012 Elsevier Interactive Patient Education [employer]2016 Elsevier Inc.

## 2016-02-13 NOTE — Progress Notes (Signed)
[facility] [address], [place] [address] [phone]  Date:  02/11/2016   Name:  Kathy Velazquez   DOB:  1986/09/16   MRN:  [id number]  PCP:  No PCP Per Patient   Chief Complaint  Patient presents with  . Ear Pain    x 1 week, right side, some bleeding   History of Present Illness:  Kathy Velazquez is a 30 y.o. female patient who presents to [facility] for right ear pain.  Right ear pain for 1 week that has progressively worsened.  She states that it is aching constantly.  She has muffled hearing from the right ear.  She has had a little bleeding from the tm.       There are no active problems to display for this patient.   History reviewed. No pertinent past medical history.  History reviewed. No pertinent past surgical history.  Social History  Substance Use Topics  . Smoking status: Never Smoker   . Smokeless tobacco: None  . Alcohol Use: None    History reviewed. No pertinent family history.  No Known Allergies  Medication list has been reviewed and updated.  Current Outpatient Prescriptions on File Prior to Visit  Medication Sig Dispense Refill  . escitalopram (LEXAPRO) 20 MG tablet Take 1 tablet (20 mg total) by mouth daily. 30 tablet 5  . ALPRAZolam (XANAX) 0.25 MG tablet Take 1 tablet (0.25 mg total) by mouth 3 (three) times daily as needed for anxiety. PATIENT NEEDS OFFICE VISIT FOR ADDITIONAL REFILLS - overdue for follow up (Patient not taking: Reported on 02/11/2016) 20 tablet 0  . Dexlansoprazole 30 MG capsule Take 30 mg by mouth daily. Reported on 02/11/2016    . hyoscyamine (LEVSIN, ANASPAZ) 0.125 MG tablet Take 0.125 mg by mouth every 4 (four) hours as needed. Reported on 02/11/2016    . omeprazole (PRILOSEC) 20 MG capsule Take 1 capsule (20 mg total) by mouth daily. (Patient not taking: Reported on 02/11/2016) 30 capsule 0   No current facility-administered medications on file prior to visit.    ROS ROS otherwise unremarkable  unless listed above.   Physical Examination: BP 118/62 mmHg  Pulse 76  Temp(Src) 99.4 F (37.4 C)  Resp 16  Ht  (1.626 m)  Wt 108 lb 12.8 oz (49.351 kg)  BMI 18.67 kg/m2 Ideal Body Weight: Weight in (lb) to have BMI = 25: 145.3  Physical Exam  Constitutional: She is oriented to person, place, and time. She appears well-developed and well-nourished. No distress.  HENT:  Head: Normocephalic and atraumatic.  Left Ear: External ear normal.  Right ear canal with erythema at 6'oclock region and appearance of furuncle.  The tm is dull and erythematous.    Eyes: Conjunctivae and EOM are normal. Pupils are equal, round, and reactive to light.  Cardiovascular: Normal rate.   Pulmonary/Chest: Effort normal. No respiratory distress.  Neurological: She is alert and oriented to person, place, and time.  Skin: She is not diaphoretic.  Psychiatric: She has a normal mood and affect. Her behavior is normal.     Assessment and Plan: Kathy Velazquez is a 30 y.o. female who is here today for cc of right ear pain. Will treat for both abscess and otitis media.  Will cover MRSA. Advised tylenol or ibuprofen.  Also advised external warm compresses.  1. Abscess of ear canal, right - sulfamethoxazole-trimethoprim (BACTRIM DS,SEPTRA DS) 800-160 MG tablet; Take 1 tablet by mouth 2 (two)  times daily.  Dispense: 20 tablet; Refill: 0 - ofloxacin (FLOXIN OTIC) 0.3 % otic solution; Place 5 drops into the right ear daily.  Dispense: 5 mL; Refill: 0 - antipyrine-benzocaine (AURALGAN) otic solution; Place 3-4 drops into the right ear every 2 (two) hours as needed for ear pain.  Dispense: 10 mL; Refill: 0  2. Subacute otitis media of right ear, recurrence not specified, unspecified otitis media type - antipyrine-benzocaine (AURALGAN) otic solution; Place 3-4 drops into the right ear every 2 (two) hours as needed for ear pain.  Dispense: 10 mL; Refill: 0   Trena PlattStephanie Jaclin Finks, PA-C [facility] 02/13/2016 9:26 PM

## 2016-02-15 ENCOUNTER — Encounter: Payer: Self-pay | Admitting: Physician Assistant

## 2016-02-18 ENCOUNTER — Telehealth: Payer: Self-pay

## 2016-02-18 NOTE — Telephone Encounter (Signed)
Unfortunately, there isn't an alternative (at least not that I am aware of). She can use OTC ibuprofen/acetaminophen for pain relief.  If the pain persists at this point, she needs to RTC for re-evaluation.

## 2016-02-18 NOTE — Telephone Encounter (Signed)
Pharm faxed note that the Auralgan otic is not available any longer and asks that you Rx something else.

## 2016-02-19 NOTE — Telephone Encounter (Signed)
Called pharm and LM with Chelle's message.

## 2020-12-24 ENCOUNTER — Encounter (HOSPITAL_COMMUNITY): Payer: Self-pay | Admitting: Behavioral Health

## 2020-12-24 ENCOUNTER — Other Ambulatory Visit: Payer: Self-pay

## 2020-12-24 ENCOUNTER — Ambulatory Visit (HOSPITAL_COMMUNITY)
Admission: EM | Admit: 2020-12-24 | Discharge: 2020-12-26 | Disposition: A | Discharge status: Other Acute Inpatient Hospital | Payer: No Payment, Other | Attending: Psychiatry | Admitting: Psychiatry

## 2020-12-24 DIAGNOSIS — F323 Major depressive disorder, single episode, severe with psychotic features: Secondary | ICD-10-CM

## 2020-12-24 DIAGNOSIS — F322 Major depressive disorder, single episode, severe without psychotic features: Secondary | ICD-10-CM | POA: Diagnosis not present

## 2020-12-24 DIAGNOSIS — Z8659 Personal history of other mental and behavioral disorders: Secondary | ICD-10-CM | POA: Diagnosis not present

## 2020-12-24 DIAGNOSIS — F1010 Alcohol abuse, uncomplicated: Secondary | ICD-10-CM | POA: Diagnosis not present

## 2020-12-24 DIAGNOSIS — Z20822 Contact with and (suspected) exposure to covid-19: Secondary | ICD-10-CM | POA: Insufficient documentation

## 2020-12-24 DIAGNOSIS — R4689 Other symptoms and signs involving appearance and behavior: Secondary | ICD-10-CM

## 2020-12-24 DIAGNOSIS — Z818 Family history of other mental and behavioral disorders: Secondary | ICD-10-CM | POA: Insufficient documentation

## 2020-12-24 LAB — POC SARS CORONAVIRUS 2 AG -  ED: SARS Coronavirus 2 Ag: NEGATIVE

## 2020-12-24 MED ORDER — MAGNESIUM HYDROXIDE 400 MG/5ML PO SUSP: 30.0000 mL | Freq: Every day | ORAL | Status: DC | PRN

## 2020-12-24 MED ORDER — HYDROXYZINE HCL 25 MG PO TABS: 25.0000 mg | ORAL_TABLET | Freq: Three times a day (TID) | ORAL | Status: DC | PRN

## 2020-12-24 MED ORDER — TRAZODONE HCL 50 MG PO TABS: 50.0000 mg | ORAL_TABLET | Freq: Every evening | ORAL | Status: DC | PRN

## 2020-12-24 MED ORDER — ALUM & MAG HYDROXIDE-SIMETH 200-200-20 MG/5ML PO SUSP: 30.0000 mL | ORAL | Status: DC | PRN

## 2020-12-24 MED ORDER — ACETAMINOPHEN 325 MG PO TABS: 650.0000 mg | ORAL_TABLET | Freq: Four times a day (QID) | ORAL | Status: DC | PRN

## 2020-12-24 NOTE — ED Notes (Signed)
Pt very resistant to procedures, refusing all lab tests to be performed.  "After much coaxing, COVID tests completed.

## 2020-12-24 NOTE — BH Assessment (Addendum)
Clinician attempted to make contact w/ pt's brother, who is the petitioner of pt's IVC, twice but he did not answer the phone; the voice mailbox was also full, so clinician was unable to leave a message requesting he return her call.  Oren Bracket, brother/petitioner: [phone]
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
## 2020-12-24 NOTE — ED Notes (Signed)
Patient refused to give urine sample
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
## 2020-12-25 LAB — RESP PANEL BY RT-PCR (FLU A&B, COVID) ARPGX2
Influenza A by PCR: NEGATIVE
Influenza B by PCR: NEGATIVE
SARS Coronavirus 2 by RT PCR: NEGATIVE

## 2020-12-25 LAB — POC SARS CORONAVIRUS 2 AG: SARS Coronavirus 2 Ag: NEGATIVE

## 2020-12-25 MED ORDER — LORAZEPAM 1 MG PO TABS: 1.0000 mg | ORAL_TABLET | Freq: Four times a day (QID) | ORAL | Status: DC | PRN

## 2020-12-25 MED ORDER — ONDANSETRON 4 MG PO TBDP: 4.0000 mg | ORAL_TABLET | Freq: Four times a day (QID) | ORAL | Status: DC | PRN

## 2020-12-25 MED ORDER — ARIPIPRAZOLE 2 MG PO TABS: 2.0000 mg | ORAL_TABLET | Freq: Every day | ORAL | Status: DC

## 2020-12-25 MED ORDER — LOPERAMIDE HCL 2 MG PO CAPS: 2.0000 mg | ORAL_CAPSULE | ORAL | Status: DC | PRN

## 2020-12-25 MED ORDER — ADULT MULTIVITAMIN W/MINERALS CH
1.0000 | ORAL_TABLET | Freq: Every day | ORAL | Status: DC
  Filled 2020-12-25: qty 1

## 2020-12-25 MED ORDER — FLUOXETINE HCL 10 MG PO CAPS: 10.0000 mg | ORAL_CAPSULE | Freq: Every day | ORAL | Status: DC

## 2020-12-25 NOTE — ED Notes (Signed)
Pt sleeping at present, no distress noted, monitoring for safety. 

## 2020-12-25 NOTE — ED Notes (Signed)
Pt sleeping in no acute distess

## 2020-12-25 NOTE — ED Notes (Addendum)
Pt requested to see IVC paperwork again, stating she does not know why she is being held at this facility.  RN explained to patient that she is IVC per the Magistrate and the Medical team is seeking placement for her for inpatient treatment.

## 2020-12-25 NOTE — ED Notes (Signed)
Pt refused all meds after multiple attempts. Stated she wasn't taking any medications

## 2020-12-25 NOTE — ED Notes (Signed)
Pt sleeping at present, no distress noted, pt is IVC, monitoring for safety.  Remains pending placement.

## 2020-12-25 NOTE — ED Notes (Signed)
Lunch given: PB&J, Gaylan Gerold, diet coke
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
## 2020-12-25 NOTE — ED Notes (Signed)
Pt denies SI/HI/AVH. Refused am medication. Limited interaction noted and pt display poor eye contact. Denies need for hospital stay and requesting to leave facility. Pt's mother called requesting update from provider. Left phone number. MD notified. Will continue to monitor for safety.

## 2020-12-25 NOTE — ED Notes (Addendum)
Pt sleeping in no acute distress. RR even and unlabored. Safety maintained. 

## 2020-12-26 ENCOUNTER — Inpatient Hospital Stay (HOSPITAL_COMMUNITY)
Admission: AD | Admit: 2020-12-26 | Discharge: 2021-01-02 | DRG: 885 | Disposition: A | Discharge status: Home / Self Care | Payer: Federal, State, Local not specified - Other | Source: Ambulatory Visit | Attending: Psychiatry | Admitting: Psychiatry

## 2020-12-26 ENCOUNTER — Telehealth: Payer: Self-pay

## 2020-12-26 ENCOUNTER — Other Ambulatory Visit: Payer: Self-pay

## 2020-12-26 ENCOUNTER — Encounter (HOSPITAL_COMMUNITY): Payer: Self-pay | Admitting: Psychiatry

## 2020-12-26 DIAGNOSIS — D509 Iron deficiency anemia, unspecified: Secondary | ICD-10-CM | POA: Diagnosis present

## 2020-12-26 DIAGNOSIS — F333 Major depressive disorder, recurrent, severe with psychotic symptoms: Secondary | ICD-10-CM | POA: Diagnosis present

## 2020-12-26 DIAGNOSIS — F10250 Alcohol dependence with alcohol-induced psychotic disorder with delusions: Secondary | ICD-10-CM

## 2020-12-26 DIAGNOSIS — Z20822 Contact with and (suspected) exposure to covid-19: Secondary | ICD-10-CM | POA: Diagnosis present

## 2020-12-26 DIAGNOSIS — E039 Hypothyroidism, unspecified: Secondary | ICD-10-CM | POA: Diagnosis present

## 2020-12-26 DIAGNOSIS — E876 Hypokalemia: Secondary | ICD-10-CM | POA: Diagnosis present

## 2020-12-26 DIAGNOSIS — N390 Urinary tract infection, site not specified: Secondary | ICD-10-CM | POA: Diagnosis present

## 2020-12-26 DIAGNOSIS — F23 Brief psychotic disorder: Secondary | ICD-10-CM

## 2020-12-26 MED ORDER — LORAZEPAM 1 MG PO TABS: 1.0000 mg | ORAL_TABLET | Freq: Four times a day (QID) | ORAL | Status: DC | PRN

## 2020-12-26 MED ORDER — ARIPIPRAZOLE 2 MG PO TABS: 2.0000 mg | ORAL_TABLET | Freq: Every day | ORAL | Status: DC

## 2020-12-26 MED ORDER — THIAMINE HCL 100 MG PO TABS
100.0000 mg | ORAL_TABLET | Freq: Every day | ORAL | Status: DC
  Administered 2020-12-27 – 2021-01-02 (×7): 100 mg via ORAL
  Filled 2020-12-26 (×9): qty 1

## 2020-12-26 MED ORDER — ADULT MULTIVITAMIN W/MINERALS CH
1.0000 | ORAL_TABLET | Freq: Every day | ORAL | Status: DC
  Administered 2020-12-27 – 2021-01-02 (×7): 1 via ORAL
  Filled 2020-12-26 (×10): qty 1

## 2020-12-26 MED ORDER — LOPERAMIDE HCL 2 MG PO CAPS: 2.0000 mg | ORAL_CAPSULE | ORAL | Status: DC | PRN

## 2020-12-26 MED ORDER — ACETAMINOPHEN 325 MG PO TABS: 650.0000 mg | ORAL_TABLET | Freq: Four times a day (QID) | ORAL | Status: DC | PRN

## 2020-12-26 MED ORDER — QUETIAPINE FUMARATE 100 MG PO TABS
100.0000 mg | ORAL_TABLET | Freq: Every day | ORAL | Status: DC
  Filled 2020-12-26 (×3): qty 1

## 2020-12-26 MED ORDER — HYDROXYZINE HCL 25 MG PO TABS
25.0000 mg | ORAL_TABLET | Freq: Three times a day (TID) | ORAL | Status: DC | PRN
  Filled 2020-12-26: qty 1

## 2020-12-26 MED ORDER — MAGNESIUM HYDROXIDE 400 MG/5ML PO SUSP: 30.0000 mL | Freq: Every day | ORAL | Status: DC | PRN

## 2020-12-26 MED ORDER — THIAMINE HCL 100 MG/ML IJ SOLN: 100.0000 mg | Freq: Once | INTRAMUSCULAR | Status: DC

## 2020-12-26 MED ORDER — HYDROXYZINE HCL 25 MG PO TABS: 25.0000 mg | ORAL_TABLET | Freq: Four times a day (QID) | ORAL | Status: DC | PRN

## 2020-12-26 MED ORDER — ONDANSETRON 4 MG PO TBDP: 4.0000 mg | ORAL_TABLET | Freq: Four times a day (QID) | ORAL | Status: DC | PRN

## 2020-12-26 MED ORDER — FLUOXETINE HCL 10 MG PO CAPS: 10.0000 mg | ORAL_CAPSULE | Freq: Every day | ORAL | 3 refills | Status: DC

## 2020-12-26 MED ORDER — ALUM & MAG HYDROXIDE-SIMETH 200-200-20 MG/5ML PO SUSP: 30.0000 mL | ORAL | Status: DC | PRN

## 2020-12-26 MED ORDER — FLUOXETINE HCL 10 MG PO CAPS
10.0000 mg | ORAL_CAPSULE | Freq: Every day | ORAL | Status: DC
  Filled 2020-12-26 (×2): qty 1

## 2020-12-26 MED ORDER — TRAZODONE HCL 50 MG PO TABS
50.0000 mg | ORAL_TABLET | Freq: Every evening | ORAL | Status: DC | PRN
  Administered 2020-12-31 – 2021-01-01 (×2): 50 mg via ORAL
  Filled 2020-12-26 (×2): qty 1

## 2020-12-26 MED ORDER — ARIPIPRAZOLE 2 MG PO TABS
2.0000 mg | ORAL_TABLET | Freq: Every day | ORAL | Status: DC
  Filled 2020-12-26 (×2): qty 1
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
## 2020-12-26 NOTE — Tx Team (Signed)
Initial Treatment Plan 12/26/2020 1:13 PM Kathy Velazquez WKM:[id number]    PATIENT STRESSORS: Marital or family conflict Medication change or noncompliance Substance abuse   PATIENT STRENGTHS: General fund of knowledge Physical Health   PATIENT IDENTIFIED PROBLEMS: Aggression towards family and property  Anxiety    "Have help with anxiety"  "Coping skills"             DISCHARGE CRITERIA:  Improved stabilization in mood, thinking, and/or behavior Need for constant or close observation no longer present Reduction of life-threatening or endangering symptoms to within safe limits Verbal commitment to aftercare and medication compliance  PRELIMINARY DISCHARGE PLAN: Outpatient therapy  PATIENT/FAMILY INVOLVEMENT: This treatment plan has been presented to and reviewed with the patient, Kathy Velazquez.  The patient and family have been given the opportunity to ask questions and make suggestions.  Levin Bacon, RN 12/26/2020, 1:13 PM

## 2020-12-26 NOTE — Progress Notes (Signed)
Pt still refusing meds, labs, and urine culture.
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
## 2020-12-26 NOTE — [facility] Group Notes (Signed)
[facility] LCSW Group Therapy  12/26/2020 2:13 PM  Participation Level:  Did Not Attend  Summary of Progress/Problems: Patient did not attend.  Kathy Velazquez A Brean Carberry 12/26/2020, 2:13 PM  

## 2020-12-26 NOTE — ED Notes (Signed)
Pt resting with eyes closed. Rise and fall of chest noted. No new issues noted at this time. Will continue to monitor for safety 

## 2020-12-26 NOTE — Progress Notes (Signed)
Pt accepted to [facility].   Dr. Jola Babinski is the attending provider.    Call report to [phone]    Zella Ball, LPN @ [facility] notified.     Pt is IVC.    Pt may be transported by [employer]   Pt scheduled  to arrive at [facility] AFTER 1000 AM  Signed:  Corky Crafts, MSW, [place], LCASA 12/26/2020 9:39 AM

## 2020-12-26 NOTE — Telephone Encounter (Signed)
Pt doesn't seem to have ICD, PPM or ILR not sure why phone call was sent to [place] in error

## 2020-12-26 NOTE — ED Notes (Addendum)
PT ACCEPTED TO [facility] PENDING BED ASSIGNMENT AFTER 8AM AS PER AC KIM BROOKS.  PT IS IVC AND WILL NEED TRANSPORT BY [employer].

## 2020-12-26 NOTE — ED Notes (Signed)
Pt ambulated to back sallyport to be transported to [facility]. No s/s pain, discomfort, or acute distress. IVC still in place at time of transport

## 2020-12-26 NOTE — ED Notes (Signed)
Pt refused medications after multiple attempts. Pt stated "i'm not taking any medications."
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
## 2020-12-26 NOTE — ED Notes (Signed)
Pt sleeping at present, no distress noted, monitoring for safety. 

## 2020-12-26 NOTE — Discharge Instructions (Addendum)
Discharge to [facility] 

## 2020-12-27 LAB — LIPID PANEL
Cholesterol: 175 mg/dL (ref 0–200)
HDL: 83 mg/dL (ref >40)
LDL Cholesterol: 82 mg/dL (ref 0–99)
Total CHOL/HDL Ratio: 2.1 RATIO
Triglycerides: 51 mg/dL (ref <150)
VLDL: 10 mg/dL (ref 0–40)

## 2020-12-27 LAB — COMPREHENSIVE METABOLIC PANEL
ALT: 11 U/L (ref 0–44)
AST: 18 U/L (ref 15–41)
Albumin: 5.1 g/dL — ABNORMAL HIGH (ref 3.5–5.0)
Alkaline Phosphatase: 60 U/L (ref 38–126)
Anion gap: 13 (ref 5–15)
BUN: 11 mg/dL (ref 6–20)
CO2: 23 mmol/L (ref 22–32)
Calcium: 9.6 mg/dL (ref 8.9–10.3)
Chloride: 107 mmol/L (ref 98–111)
Creatinine, Ser: 0.69 mg/dL (ref 0.44–1.00)
GFR, Estimated: >60 mL/min (ref >60)
Glucose, Bld: 92 mg/dL (ref 70–99)
Potassium: 3.1 mmol/L — ABNORMAL LOW (ref 3.5–5.1)
Sodium: 143 mmol/L (ref 135–145)
Total Bilirubin: 0.5 mg/dL (ref 0.3–1.2)
Total Protein: 8.2 g/dL — ABNORMAL HIGH (ref 6.5–8.1)

## 2020-12-27 LAB — CBC WITH DIFFERENTIAL/PLATELET
Abs Immature Granulocytes: 0.01 10*3/uL (ref 0.00–0.07)
Basophils Absolute: 0.1 10*3/uL (ref 0.0–0.1)
Basophils Relative: 1 %
Eosinophils Absolute: 0.1 10*3/uL (ref 0.0–0.5)
Eosinophils Relative: 1 %
HCT: 31.7 % — ABNORMAL LOW (ref 36.0–46.0)
Hemoglobin: 8.3 g/dL — ABNORMAL LOW (ref 12.0–15.0)
Immature Granulocytes: 0 %
Lymphocytes Relative: 50 %
Lymphs Abs: 3.1 10*3/uL (ref 0.7–4.0)
MCH: 17.7 pg — ABNORMAL LOW (ref 26.0–34.0)
MCHC: 26.2 g/dL — ABNORMAL LOW (ref 30.0–36.0)
MCV: 67.4 fL — ABNORMAL LOW (ref 80.0–100.0)
Monocytes Absolute: 0.7 10*3/uL (ref 0.1–1.0)
Monocytes Relative: 11 %
Neutro Abs: 2.3 10*3/uL (ref 1.7–7.7)
Neutrophils Relative %: 37 %
Platelets: 234 10*3/uL (ref 150–400)
RBC: 4.7 MIL/uL (ref 3.87–5.11)
RDW: 21.2 % — ABNORMAL HIGH (ref 11.5–15.5)
WBC: 6.2 10*3/uL (ref 4.0–10.5)
nRBC: 0 % (ref 0.0–0.2)

## 2020-12-27 LAB — TSH: TSH: 3.548 u[IU]/mL (ref 0.350–4.500)

## 2020-12-27 LAB — HEMOGLOBIN A1C
Hgb A1c MFr Bld: 5 % (ref 4.8–5.6)
Mean Plasma Glucose: 96.8 mg/dL

## 2020-12-27 MED ORDER — OLANZAPINE 10 MG IM SOLR: 5.0000 mg | Freq: Two times a day (BID) | INTRAMUSCULAR | Status: DC | PRN

## 2020-12-27 MED ORDER — OLANZAPINE 10 MG PO TBDP
10.0000 mg | ORAL_TABLET | Freq: Every day | ORAL | Status: DC
Start: 2020-12-27 — End: 2020-12-28
  Administered 2020-12-27: 10 mg via ORAL
  Filled 2020-12-27 (×2): qty 1

## 2020-12-27 MED ORDER — OLANZAPINE 5 MG PO TBDP
5.0000 mg | ORAL_TABLET | Freq: Every day | ORAL | Status: DC
  Administered 2020-12-27 – 2020-12-30 (×4): 5 mg via ORAL
  Filled 2020-12-27 (×7): qty 1

## 2020-12-27 NOTE — Progress Notes (Signed)
[facility] Second Physician Opinion Progress Note for Medication Administration to Non-consenting Patients (For Involuntarily Committed Patients)  Patient: COLTON ENGDAHL Date of Birth: [birth year] MRN: [id number]  Reason for the Medication: The patient, without the benefit of the specific treatment measure, is incapable of participating in any available treatment plan that will give the patient a realistic opportunity of improving the patient's condition. There is, without the benefit of the specific treatment measure, a significant possibility that the patient will harm self or others before improvement of the patient's condition is realized.  Consideration of Side Effects: Consideration of the side effects related to the medication plan has been given.  Rationale for Medication Administration: Patient will require medication to improve her condition. Her level of psychosis is such that she represents a danger to herself and others.    Clement Sayres, MD 12/27/20  11:13 AM   This documentation is good for (7) seven days from the date of the MD signature. New documentation must be completed every seven (7) days with detailed justification in the medical record if the patient requires continued non-emergent administration of psychotropic medications.

## 2020-12-27 NOTE — Progress Notes (Signed)
D: Patient presents with flat affect and is anxious at the time of assessment. Patient refused medication at time of assessment stating "the doctor didn't prescribe me any medication." RN offered reassurance and explanation of medication but patient ultimately refused. Patient denies SI/HI at this time. Patient also denies AH/VH at this time. Patient contracts for safety.  A: Provided positive reinforcement and encouragement. Provided medication education.  R: Patient not medication compliant at this time. Patient remains safe on the unit.   12/26/20 2036  Psych Admission Type (Psych Patients Only)  Admission Status Involuntary  Psychosocial Assessment  Patient Complaints Anxiety  Eye Contact Avoids  Facial Expression Blank;Flat  Affect Flat  Speech Logical/coherent;Soft  Interaction Minimal;Guarded;Avoidant;Cautious  Motor Activity Other (Comment) (WDL)  Appearance/Hygiene Unremarkable  Behavior Characteristics Cooperative;Anxious  Mood Anxious  Thought Process  Coherency WDL  Content Paranoia  Delusions Paranoid  Perception WDL  Hallucination None reported or observed  Judgment Poor  Confusion Mild  Danger to Self  Current suicidal ideation? Denies  Danger to Others  Danger to Others None reported or observed

## 2020-12-27 NOTE — [facility] Suicide Risk Assessment (Signed)
[facility] INPATIENT:  Family/Significant Other Suicide Prevention Education  Refusal of Consents:  Suicide Prevention Education:  Patient Refusal for Family/Significant Other Suicide Prevention Education: The patient Kathy Velazquez has refused to provide written consent for family/significant other to be provided Family/Significant Other Suicide Prevention Education during admission and/or prior to discharge.  Physician notified.   SPE completed with patient, as patient refused to consent to family contact. SPI pamphlet provided to pt and pt was encouraged to share information with support network, ask questions, and talk about any concerns relating to SPE. Patient denies access to guns/firearms and verbalized understanding of information provided. Mobile Crisis information also provided to patient.   Ruthann Cancer MSW, LCSW Clincal Social Worker  [facility]

## 2020-12-27 NOTE — Progress Notes (Signed)
   12/27/20 0705  Vital Signs  Temp 97.9 F (36.6 C)  Temp Source Oral  Pulse Rate (!) 111  Resp 18  BP (!) 133/95  BP Location Left Arm  BP Method Automatic  Patient Position (if appropriate) Lying  Oxygen Therapy  SpO2 100 %   D: Patient denies SI/HI/AVH. Patient rated anxiety 5/10 and denied depression. Pt. Was isolative to  Her room. A:  Patient refused medicine.  Support and encouragement provided Routine safety checks conducted every 15 minutes. Patient  Informed to notify staff with any concerns.   R: Safety maintained.
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
## 2020-12-27 NOTE — Progress Notes (Signed)
D: Patient presents with flat affect. Patient is medication compliant at this time. Patient denies SI/HI at this time. Patient also denies AH/VH at this time. Patient contracts for safety.  A: Provided positive reinforcement and encouragement.  R: Patient cooperative and receptive to efforts. Patient remains safe on the unit.   12/27/20 2200  Psych Admission Type (Psych Patients Only)  Admission Status Involuntary  Psychosocial Assessment  Patient Complaints Anxiety  Eye Contact Brief  Facial Expression Flat;Blank  Affect Flat  Speech Logical/coherent;Soft  Interaction Minimal;Guarded;Cautious;Avoidant  Motor Activity Other (Comment) (WDL)  Appearance/Hygiene Unremarkable  Behavior Characteristics Cooperative;Appropriate to situation  Mood Anxious  Thought Process  Coherency WDL  Content Paranoia  Delusions Paranoid  Perception WDL  Hallucination None reported or observed  Judgment Poor  Confusion Mild  Danger to Self  Current suicidal ideation? Denies  Danger to Others  Danger to Others None reported or observed

## 2020-12-27 NOTE — Progress Notes (Signed)
Recreation Therapy Notes  Date: 2.10.22 Time: 0950 Location: [address] Dayroom   Group Topic: Communication, Team Building, Problem Solving  Goal Area(s) Addresses:  Patient will effectively work with peer towards shared goal.  Patient will identify skills used to make activity successful.  Patient will share challenges and verbalize solution-driven approaches used. Patient will identify how skills used during activity can be used to reach post d/c goals.   Intervention: STEM Activity   Activity: [employer]. Patients were provided the following materials: 5 drinking straws, 5 rubber bands, 5 paper clips, 2 index cards and 2 drinking cups. Using the provided materials patients were asked to build a launching mechanism to launch a ping pong ball across the room, approximately 10 feet. Patients were divided into teams of 3-5. Instructions required all materials be incorporated into the device, functionality of items left to the peer group's discretion.  Education: Pharmacist, community, Scientist, physiological, Air cabin crew, Building control surveyor.   Education Outcome: Acknowledges education/In group clarification offered/Needs additional education.   Clinical Observations/Feedback: Pt did not attend group session.    Caroll Rancher, LRT/CTRS    Caroll Rancher A 12/27/2020 11:39 AM

## 2020-12-27 NOTE — BHH Group Notes (Signed)
Occupational Therapy Group Note Date: 12/26/2020 Group Topic/Focus: Coping Skills  Group Description: Group encouraged increased social engagement and participation through discussion/activity focused on "Building Happiness." Patients engaged in a structured discussion and worked through six strategies to building happiness and improving mood including: identifying gratitudes, acts of kindness, exercise, meditation, positive journaling, and fostering relationships. Patients were encouraged to identify one of the six strategies to begin using as a way to boost their mood and add to their therapeutic toolkit.   Therapeutic Goal(s): Demonstrate age-appropriate social skills and socialization within a structured group setting Demonstrate orientation to topic and identify relevant responses to group discussion.  Participation Level: Patient did not attend OT group session despite personal invitation. Pt was sleeping in bed when invited by MHT.  Plan: Continue to engage patient in OT groups 2 - 3x/week.  12/27/2020  Donne Hazel, MOT, OTR/L

## 2020-12-27 NOTE — Progress Notes (Signed)
Recreation Therapy Notes  INPATIENT RECREATION THERAPY ASSESSMENT  Patient Details Name: Kathy Velazquez MRN: [id number] DOB: 07/29/1986 Today's Date: 12/27/2020       Information Obtained From: Patient  Able to Participate in Assessment/Interview: Yes  Patient Presentation: Alert (Paranoid; Soft spoken)  Reason for Admission (Per Patient): Other (Comments) (Altercation with family; Anxiety)  Patient Stressors: Other (Comment) (Unemployed)  Coping Skills:   TV,Music,Talk,Read  Leisure Interests (2+):  Individual - Reading,Individual - TV  Frequency of Recreation/Participation: Other (Comment) (Daily)  Awareness of Community Resources:  Yes  Community Resources:  Library,Park,Restaurants,Mall  Current Use: No  If no, Barriers?:    Expressed Interest in [employer] Information: No  County of Residence:  [place]  Patient Main Form of Transportation: Set designer  Patient Strengths:  Good listener  Patient Identified Areas of Improvement:  "Keeping in touch with people"  Patient Goal for Hospitalization:  "reduce anxiety"  Current SI (including self-harm):  No  Current HI:  No  Current AVH: No  Staff Intervention Plan: Group Attendance,Collaborate with Interdisciplinary Treatment Team  Consent to Intern Participation: N/A    Caroll Rancher, LRT/CTRS  Caroll Rancher A 12/27/2020, 12:32 PM

## 2020-12-27 NOTE — Progress Notes (Signed)
   12/27/20 0500  Sleep  Number of Hours 5.75   
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
## 2020-12-28 LAB — URINALYSIS, COMPLETE (UACMP) WITH MICROSCOPIC
Bilirubin Urine: NEGATIVE
Glucose, UA: NEGATIVE mg/dL
Ketones, ur: 5 mg/dL — AB
Nitrite: NEGATIVE
Protein, ur: 100 mg/dL — AB
RBC / HPF: >50 RBC/hpf — ABNORMAL HIGH (ref 0–5)
Specific Gravity, Urine: 1.033 — ABNORMAL HIGH (ref 1.005–1.030)
WBC, UA: >50 WBC/hpf — ABNORMAL HIGH (ref 0–5)
pH: 5 (ref 5.0–8.0)

## 2020-12-28 LAB — RAPID URINE DRUG SCREEN, HOSP PERFORMED
Amphetamines: NOT DETECTED
Barbiturates: NOT DETECTED
Benzodiazepines: NOT DETECTED
Cocaine: NOT DETECTED
Opiates: NOT DETECTED
Tetrahydrocannabinol: NOT DETECTED

## 2020-12-28 LAB — IRON AND TIBC
Iron: 29 ug/dL (ref 28–170)
Saturation Ratios: 5 % — ABNORMAL LOW (ref 10.4–31.8)
TIBC: 606 ug/dL — ABNORMAL HIGH (ref 250–450)
UIBC: 577 ug/dL

## 2020-12-28 LAB — RETICULOCYTES
Immature Retic Fract: 21.9 % — ABNORMAL HIGH (ref 2.3–15.9)
RBC.: 4.63 MIL/uL (ref 3.87–5.11)
Retic Count, Absolute: 58.8 10*3/uL (ref 19.0–186.0)
Retic Ct Pct: 1.3 % (ref 0.4–3.1)

## 2020-12-28 LAB — VITAMIN B12: Vitamin B-12: 260 pg/mL (ref 180–914)

## 2020-12-28 LAB — FERRITIN: Ferritin: 3 ng/mL — ABNORMAL LOW (ref 11–307)

## 2020-12-28 LAB — PREGNANCY, URINE: Preg Test, Ur: NEGATIVE

## 2020-12-28 LAB — FOLATE: Folate: 16.1 ng/mL (ref >5.9)

## 2020-12-28 MED ORDER — POTASSIUM CHLORIDE CRYS ER 20 MEQ PO TBCR
20.0000 meq | EXTENDED_RELEASE_TABLET | Freq: Two times a day (BID) | ORAL | Status: AC
  Administered 2020-12-28 (×2): 20 meq via ORAL
  Filled 2020-12-28 (×2): qty 1

## 2020-12-28 MED ORDER — OLANZAPINE 5 MG PO TBDP
15.0000 mg | ORAL_TABLET | Freq: Every day | ORAL | Status: DC
  Administered 2020-12-28 – 2020-12-29 (×2): 15 mg via ORAL
  Filled 2020-12-28 (×4): qty 3

## 2020-12-28 MED ORDER — SULFAMETHOXAZOLE-TRIMETHOPRIM 800-160 MG PO TABS
1.0000 | ORAL_TABLET | Freq: Two times a day (BID) | ORAL | Status: DC
  Administered 2020-12-28 – 2021-01-02 (×11): 1 via ORAL
  Filled 2020-12-28 (×17): qty 1

## 2020-12-28 MED ORDER — ENSURE ENLIVE PO LIQD
237.0000 mL | Freq: Two times a day (BID) | ORAL | Status: DC
  Administered 2020-12-28 – 2021-01-02 (×6): 237 mL via ORAL
  Filled 2020-12-28 (×12): qty 237

## 2020-12-28 MED ORDER — FOLIC ACID 1 MG PO TABS
1.0000 mg | ORAL_TABLET | Freq: Every day | ORAL | Status: DC
  Administered 2020-12-28 – 2021-01-02 (×6): 1 mg via ORAL
  Filled 2020-12-28 (×7): qty 1

## 2020-12-28 NOTE — [facility] Group Notes (Signed)
[facility] LCSW Group Therapy 12/28/2020 1:15 PM  Type of Therapy: Group Therapy: Boundaries  Participation Level: Did Not Attend  Summary of Progress/Problems:  Patient declined to attend group.   Cheyeanne Roadcap MSW, LCSW Clincal Social Worker  [facility] 

## 2020-12-28 NOTE — Tx Team (Signed)
Interdisciplinary Treatment and Diagnostic Plan Update  12/28/2020 Time of Session: 9:00am Kathy Velazquez MRN: [id number]  Principal Diagnosis: <principal problem not specified>  Secondary Diagnoses: Active Problems:   MDD (major depressive disorder), recurrent, severe, with psychosis ([place])   Current Medications:  Current Facility-Administered Medications  Medication Dose Route Frequency Provider Last Rate Last Admin  . acetaminophen (TYLENOL) tablet 650 mg  650 mg Oral Q6H PRN Sharma Covert, MD      . alum & mag hydroxide-simeth (MAALOX/MYLANTA) 200-200-20 MG/5ML suspension 30 mL  30 mL Oral Q4H PRN Sharma Covert, MD      . folic acid (FOLVITE) tablet 1 mg  1 mg Oral Daily Sharma Covert, MD      . hydrOXYzine (ATARAX/VISTARIL) tablet 25 mg  25 mg Oral TID PRN Sharma Covert, MD      . LORazepam (ATIVAN) tablet 1 mg  1 mg Oral Q6H PRN Sharma Covert, MD      . magnesium hydroxide (MILK OF MAGNESIA) suspension 30 mL  30 mL Oral Daily PRN Sharma Covert, MD      . multivitamin with minerals tablet 1 tablet  1 tablet Oral Daily Sharma Covert, MD   1 tablet at 12/27/20 1236  . OLANZapine (ZYPREXA) injection 5 mg  5 mg Intramuscular BID PRN Sharma Covert, MD      . OLANZapine zydis [facility]) disintegrating tablet 10 mg  10 mg Oral QHS Sharma Covert, MD   10 mg at 12/27/20 2157  . OLANZapine zydis (ZYPREXA) disintegrating tablet 5 mg  5 mg Oral Daily Sharma Covert, MD   5 mg at 12/27/20 1238  . ondansetron (ZOFRAN-ODT) disintegrating tablet 4 mg  4 mg Oral Q6H PRN Sharma Covert, MD      . potassium chloride SA (KLOR-CON) CR tablet 20 mEq  20 mEq Oral BID Sharma Covert, MD      . thiamine (B-1) injection 100 mg  100 mg Intramuscular Once Sharma Covert, MD      . thiamine tablet 100 mg  100 mg Oral Daily Sharma Covert, MD   100 mg at 12/27/20 1236  . traZODone (DESYREL) tablet 50 mg  50 mg Oral QHS PRN Sharma Covert, MD        PTA Medications: Medications Prior to Admission  Medication Sig Dispense Refill Last Dose  . ARIPiprazole (ABILIFY) 2 MG tablet Take 1 tablet (2 mg total) by mouth daily.     Marland Kitchen FLUoxetine (PROZAC) 10 MG capsule Take 1 capsule (10 mg total) by mouth daily.  3     Patient Stressors: Marital or family conflict Medication change or noncompliance Substance abuse  Patient Strengths: General fund of knowledge Physical Health  Treatment Modalities: Medication Management, Group therapy, Case management,  1 to 1 session with clinician, Psychoeducation, Recreational therapy.   Physician Treatment Plan for Primary Diagnosis: <principal problem not specified> Long Term Goal(s): Improvement in symptoms so as ready for discharge Improvement in symptoms so as ready for discharge   Short Term Goals: Ability to identify changes in lifestyle to reduce recurrence of condition will improve Ability to verbalize feelings will improve Ability to demonstrate self-control will improve Ability to identify and develop effective coping behaviors will improve Ability to maintain clinical measurements within normal limits will improve Ability to identify triggers associated with substance abuse/mental health issues will improve Ability to identify changes in lifestyle to reduce recurrence of condition will improve Ability to  verbalize feelings will improve Ability to demonstrate self-control will improve Ability to identify and develop effective coping behaviors will improve Ability to maintain clinical measurements within normal limits will improve Ability to identify triggers associated with substance abuse/mental health issues will improve  Medication Management: Evaluate patient's response, side effects, and tolerance of medication regimen.  Therapeutic Interventions: 1 to 1 sessions, Unit Group sessions and Medication administration.  Evaluation of Outcomes: Not Met  Physician Treatment Plan for  Secondary Diagnosis: Active Problems:   MDD (major depressive disorder), recurrent, severe, with psychosis ([place])  Long Term Goal(s): Improvement in symptoms so as ready for discharge Improvement in symptoms so as ready for discharge   Short Term Goals: Ability to identify changes in lifestyle to reduce recurrence of condition will improve Ability to verbalize feelings will improve Ability to demonstrate self-control will improve Ability to identify and develop effective coping behaviors will improve Ability to maintain clinical measurements within normal limits will improve Ability to identify triggers associated with substance abuse/mental health issues will improve Ability to identify changes in lifestyle to reduce recurrence of condition will improve Ability to verbalize feelings will improve Ability to demonstrate self-control will improve Ability to identify and develop effective coping behaviors will improve Ability to maintain clinical measurements within normal limits will improve Ability to identify triggers associated with substance abuse/mental health issues will improve     Medication Management: Evaluate patient's response, side effects, and tolerance of medication regimen.  Therapeutic Interventions: 1 to 1 sessions, Unit Group sessions and Medication administration.  Evaluation of Outcomes: Not Met   RN Treatment Plan for Primary Diagnosis: <principal problem not specified> Long Term Goal(s): Knowledge of disease and therapeutic regimen to maintain health will improve  Short Term Goals: Ability to remain free from injury will improve, Ability to verbalize frustration and anger appropriately will improve, Ability to participate in decision making will improve, Ability to verbalize feelings will improve, Ability to identify and develop effective coping behaviors will improve and Compliance with prescribed medications will improve  Medication Management: RN will administer  medications as ordered by provider, will assess and evaluate patient's response and provide education to patient for prescribed medication. RN will report any adverse and/or side effects to prescribing provider.  Therapeutic Interventions: 1 on 1 counseling sessions, Psychoeducation, Medication administration, Evaluate responses to treatment, Monitor vital signs and CBGs as ordered, Perform/monitor CIWA, COWS, AIMS and Fall Risk screenings as ordered, Perform wound care treatments as ordered.  Evaluation of Outcomes: Not Met   LCSW Treatment Plan for Primary Diagnosis: <principal problem not specified> Long Term Goal(s): Safe transition to appropriate next level of care at discharge, Engage patient in therapeutic group addressing interpersonal concerns.  Short Term Goals: Engage patient in aftercare planning with referrals and resources, Increase social support, Increase ability to appropriately verbalize feelings, Identify triggers associated with mental health/substance abuse issues and Increase skills for wellness and recovery  Therapeutic Interventions: Assess for all discharge needs, 1 to 1 time with Social worker, Explore available resources and support systems, Assess for adequacy in community support network, Educate family and significant other(s) on suicide prevention, Complete Psychosocial Assessment, Interpersonal group therapy.  Evaluation of Outcomes: Not Met   Progress in Treatment: Attending groups: No. Participating in groups: No. Taking medication as prescribed: Yes. Toleration medication: Yes. Family/Significant other contact made: No, will contact:  patient declined consents  Patient understands diagnosis: No. Discussing patient identified problems/goals with staff: No. Medical problems stabilized or resolved: Yes. Denies suicidal/homicidal ideation: Yes. Issues/concerns  per patient self-inventory: No.  New problem(s) identified: No, Describe:  none  New Short  Term/Long Term Goal(s): medication stabilization, elimination of SI thoughts, development of comprehensive mental wellness plan.   Patient Goals:  "To lessen my anxiety"  Discharge Plan or Barriers: Patient is currently declining all outpatient treatment.  Reason for Continuation of Hospitalization: Delusions  Hallucinations Medication stabilization  Estimated Length of Stay: 3-5 days  Attendees: Patient: Kathy Velazquez 12/28/2020   Physician: Myles Lipps, MD 12/28/2020  Nursing:  12/28/2020   RN Care Manager: 12/28/2020  Social Worker: Darletta Moll, LCSW 12/28/2020   Recreational Therapist:  12/28/2020   Other:  12/28/2020   Other:  12/28/2020   Other: 12/28/2020    Scribe for Treatment Team: Vassie Moselle, LCSW 12/28/2020 9:20 AM

## 2020-12-28 NOTE — Progress Notes (Signed)
Pt endorses anxiety, but denies depression, SI/HI or AVH.  Pt appears to be responding to internal stimuli.  Pt also appears to be paranoid.  Pt took medications and got labs drawn this evening.  Pt encouraged to drink fluids as much as possible.  Pt remains safe on the unit with q 15 min checks in place.

## 2020-12-28 NOTE — Progress Notes (Signed)
Adult Psychoeducational Group Note  Date:  12/28/2020 Time:  9:25 PM  Group Topic/Focus:  Wrap-Up Group:   The focus of this group is to help patients review their daily goal of treatment and discuss progress on daily workbooks.  Participation Level:  Did Not Attend  Participation Quality:  Did Not Attend  Affect:  Did Not Attend  Cognitive:  Did Not Attend  Insight: None  Engagement in Group:  Did Not Attend  Modes of Intervention:  Did Not Attend  Additional Comments:  Pt did not attend evening wrap up group tonight.  Felipa Furnace 12/28/2020, 9:25 PM
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
## 2020-12-28 NOTE — Progress Notes (Signed)
Recreation Therapy Notes  Date: 2.11.22 Time: 1005 Location: 500 Hall Dayroom   Group Topic: Self-esteem  Goal Area(s) Addresses:  Patient will identify positive traits about themselves.  Patient will acknowledge the benefit of healthy self-esteem. Patient will endorse understanding of ways to increase self-esteem.   Intervention: Personalized Plate- printed license plate template, markers, colored pencils   Activity: Personalized Plate.  LRT talked with patients about the importance of healthy self esteem and what influences it.  Patients were to then design a license plate that highlighted some of their positive attributes, people who are important to them, achievements, things they hope to accomplish and dates of things they have achieved.   Education: LRT educated patients on the importance of healthy self-esteem and ways to build self-esteem.  Education Outcome: Acknowledges education/In group clarification offered   Clinical Observations/Feedback: Pt did not attend group session.     Caroll Rancher, LRT/CTRS     Caroll Rancher A 12/28/2020 11:49 AM

## 2020-12-28 NOTE — Progress Notes (Signed)
D: Patient presents with flat affect and is guarded and minimal upon assessment. Patient is hesitant with medication but complied after reassurance from RN. Patient remains isolative to her room. Patient denies SI/HI at this time. Patient also denies AH/VH at this time. Patient contracts for safety.  A: Provided positive reinforcement and encouragement. Provided medication education and reassurance.  R: Patient cooperative and receptive to efforts. Patient remains safe on the unit.   12/28/20 2037  Psych Admission Type (Psych Patients Only)  Admission Status Involuntary  Psychosocial Assessment  Patient Complaints None  Eye Contact Brief  Facial Expression Flat;Blank  Affect Flat  Speech Soft;Logical/coherent  Interaction Guarded;Minimal;Cautious  Motor Activity Other (Comment) (WDL)  Appearance/Hygiene Unremarkable  Behavior Characteristics Cooperative;Appropriate to situation  Mood Pleasant  Thought Process  Coherency WDL  Content Paranoia  Delusions Paranoid  Perception WDL  Hallucination None reported or observed  Judgment Poor  Confusion Mild  Danger to Self  Current suicidal ideation? Denies  Danger to Others  Danger to Others None reported or observed

## 2020-12-29 LAB — POC SARS CORONAVIRUS 2 AG: SARS Coronavirus 2 Ag: NEGATIVE

## 2020-12-29 NOTE — [facility] Group Notes (Signed)
  [facility] LCSW Group Therapy Note  Date/Time:  12/29/2020 11:15AM-12:00PM  Type of Therapy and Topic:  Group Therapy:  Feelings About Hospitalization  Participation Level:  Did Not Attend   Description of Group This process group involved patients discussing their feelings related to being hospitalized, as well as the benefits they see to being in the hospital.  These feelings and benefits were itemized.  The group then brainstormed specific ways in which they could seek those same benefits when they discharge and return home.  Therapeutic Goals 1. Patient will identify and describe positive and negative feelings related to hospitalization 2. Patient will verbalize benefits of hospitalization to themselves personally 3. Patients will brainstorm together ways they can obtain similar benefits in the outpatient setting, identify barriers to wellness and possible solutions   Therapeutic Modalities Cognitive Behavioral Therapy Motivational Interviewing    Ambrose Mantle, LCSW 12/29/2020, 2:35 PM

## 2020-12-29 NOTE — [facility] Group Notes (Signed)
.  Psychoeducational Group Note  Date: 12-29-2020 Time: 0900-1000    Goal Setting   Purpose of Group: This group helps to provide patients with the steps of setting a goal that is specific, measurable, attainable, realistic and time specific. A discussion on how we keep ourselves stuck with negative self talk.    Participation Level:  Did not attend   Deadra Diggins A  
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
## 2020-12-29 NOTE — Progress Notes (Signed)
   12/29/20 1000  Psych Admission Type (Psych Patients Only)  Admission Status Involuntary  Psychosocial Assessment  Patient Complaints None  Eye Contact Brief  Facial Expression Flat;Blank  Affect Flat  Speech Soft;Logical/coherent  Interaction Guarded;Minimal;Cautious  Motor Activity Other (Comment) (WDL)  Appearance/Hygiene Unremarkable  Behavior Characteristics Cooperative  Mood Preoccupied  Thought Process  Coherency WDL  Content Paranoia  Delusions Paranoid  Perception WDL  Hallucination None reported or observed  Judgment Poor  Confusion Mild  Danger to Self  Current suicidal ideation? Denies  Danger to Others  Danger to Others None reported or observed

## 2020-12-30 MED ORDER — RISPERIDONE 1 MG PO TBDP
3.0000 mg | ORAL_TABLET | Freq: Every day | ORAL | Status: DC
Start: 2020-12-30 — End: 2020-12-31
  Administered 2020-12-30: 3 mg via ORAL
  Filled 2020-12-30: qty 3
  Filled 2020-12-30: qty 1
  Filled 2020-12-30: qty 3

## 2020-12-30 MED ORDER — RISPERIDONE 2 MG PO TBDP
2.0000 mg | ORAL_TABLET | Freq: Every day | ORAL | Status: DC
  Administered 2020-12-30 – 2021-01-02 (×4): 2 mg via ORAL
  Filled 2020-12-30 (×6): qty 1

## 2020-12-30 NOTE — [facility] Group Notes (Signed)
Adult Psychoeducational Group Not Date:  12/30/2020 Time:  0900-1045 Group Topic/Focus: PROGRESSIVE RELAXATION. Velazquez group where deep breathing is taught and tensing and relaxation muscle groups is used. Imagery is used as well.  Pts are asked to imagine 3 pillars that hold them up when they are not able to hold themselves up.  Participation Level:  Did not attend   Kathy Velazquez    

## 2020-12-30 NOTE — Progress Notes (Signed)
   12/29/20 2130  Psych Admission Type (Psych Patients Only)  Admission Status Involuntary  Psychosocial Assessment  Patient Complaints None  Eye Contact Brief  Facial Expression Flat;Blank  Affect Flat  Speech Soft;Logical/coherent  Interaction Guarded;Minimal;Cautious;Isolative  Motor Activity Other (Comment) (WDL)  Appearance/Hygiene Unremarkable  Behavior Characteristics Appropriate to situation  Mood Depressed  Thought Process  Coherency WDL  Content Paranoia  Delusions Paranoid  Perception WDL  Hallucination None reported or observed  Judgment Poor  Confusion Mild  Danger to Self  Current suicidal ideation? Denies  Danger to Others  Danger to Others None reported or observed

## 2020-12-30 NOTE — Progress Notes (Signed)
   12/30/20 1800  Psych Admission Type (Psych Patients Only)  Admission Status Involuntary  Psychosocial Assessment  Patient Complaints None  Eye Contact Fair  Facial Expression Anxious  Affect Fearful;Anxious  Speech Soft;Logical/coherent  Interaction Guarded;Isolative  Motor Activity Other (Comment) (steady gait)  Appearance/Hygiene Unremarkable  Thought Process  Coherency WDL  Content Paranoia  Delusions Paranoid  Perception WDL  Hallucination None reported or observed  Judgment Poor  Confusion Mild  Danger to Self  Current suicidal ideation? Denies  Danger to Others  Danger to Others None reported or observed

## 2020-12-30 NOTE — [facility] Group Notes (Signed)
[facility] LCSW Group Therapy Note  Date/Time:  12/30/2020  11:00AM-12:00PM  Type of Therapy and Topic:  Group Therapy:  Music and Mood  Participation Level:  Did Not Attend     Yannis Broce Grossman-Orr, LCSW    

## 2020-12-30 NOTE — Progress Notes (Signed)
   12/30/20 0500  Sleep  Number of Hours 8.5

## 2020-12-30 NOTE — Progress Notes (Signed)
[place] NOVEL CORONAVIRUS (COVID-19) DAILY CHECK-OFF SYMPTOMS - answer yes or no to each - every day NO YES  Have you had a fever in the past 24 hours?  . Fever (Temp > 37.80C / 100F) X   Have you had any of these symptoms in the past 24 hours? . New Cough .  Sore Throat  .  Shortness of Breath .  Difficulty Breathing .  Unexplained Body Aches   X   Have you had any one of these symptoms in the past 24 hours not related to allergies?   . Runny Nose .  Nasal Congestion .  Sneezing   X   If you have had runny nose, nasal congestion, sneezing in the past 24 hours, has it worsened?  X   EXPOSURES - check yes or no X   Have you traveled outside the state in the past 14 days?  X   Have you been in contact with someone with a confirmed diagnosis of COVID-19 or PUI in the past 14 days without wearing appropriate PPE?  X   Have you been living in the same home as a person with confirmed diagnosis of COVID-19 or a PUI (household contact)?    X   Have you been diagnosed with COVID-19?    X              What to do next: Answered NO to all: Answered YES to anything:   Proceed with unit schedule Follow the [facility] Inpatient Flowsheet.   
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
## 2020-12-30 NOTE — Progress Notes (Signed)
   12/29/20 2130  COVID-19 Daily Checkoff  Have you had a fever (temp > 37.80C/100F)  in the past 24 hours?  No  If you have had runny nose, nasal congestion, sneezing in the past 24 hours, has it worsened? No  COVID-19 EXPOSURE  Have you traveled outside the state in the past 14 days? No  Have you been in contact with someone with a confirmed diagnosis of COVID-19 or PUI in the past 14 days without wearing appropriate PPE? No  Have you been living in the same home as a person with confirmed diagnosis of COVID-19 or a PUI (household contact)? No  Have you been diagnosed with COVID-19? No   

## 2020-12-30 NOTE — Progress Notes (Signed)
EKG results placed on the outside of pt's shadow chart   Normal sinus rhythm Normal ECG QT/QTc-  394/471 ms

## 2020-12-31 LAB — PATHOLOGIST SMEAR REVIEW

## 2020-12-31 LAB — T4, FREE: Free T4: 0.8 ng/dL (ref 0.61–1.12)

## 2020-12-31 MED ORDER — RISPERIDONE 2 MG PO TBDP
4.0000 mg | ORAL_TABLET | Freq: Every day | ORAL | Status: DC
  Administered 2020-12-31 – 2021-01-01 (×2): 4 mg via ORAL
  Filled 2020-12-31 (×5): qty 2
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
## 2020-12-31 NOTE — [facility] Group Notes (Signed)
[facility] LCSW Group Therapy  12/31/2020 1:18 PM  Type of Therapy:  Group Therapy: Self-Compassion   Participation Level:  Did Not Attend  Summary of Progress/Problems: Patient declined to attend group.   Bianna Haran A Sorren Vallier 12/31/2020, 1:18 PM

## 2020-12-31 NOTE — Progress Notes (Signed)
Pt continues to present with paranoia.  Pt is guarded on approach and presents with flat affect and depressed mood.  Pt denies SI/HI/AVH.  RN provided support and assessed for needs/concerns.  Pt took medications without incident and remains safe on the unit with q 15 min checks in place.

## 2020-12-31 NOTE — Progress Notes (Signed)
DAR NOTE: Patient presents with anxious affect and depressed mood.  Denies pain, auditory and visual hallucinations. Maintained on routine safety checks.  Medications given as prescribed.  Support and encouragement offered as needed. Will continue to monitor. 

## 2020-12-31 NOTE — Progress Notes (Signed)
Psychoeducational Group Note  Date:  12/31/2020 Time:  0052  Group Topic/Focus:  Wrap-Up Group:   The focus of this group is to help patients review their daily goal of treatment and discuss progress on daily workbooks.  Participation Level: Did Not Attend  Participation Quality:  Not Applicable  Affect:  Not Applicable  Cognitive:  Not Applicable  Insight:  Not Applicable  Engagement in Group: Not Applicable  Additional Comments:  The patient did not attend group since she was asleep.   Hazle Coca S 12/31/2020, 12:52 AM

## 2020-12-31 NOTE — Progress Notes (Signed)
Recreation Therapy Notes  Date: 2.14.22 Time: 1000 Location: 500 Hall Dayroom  Group Topic: Stress Management  Goal Area(s) Addresses:  Patient will identify stressful situations. Patient will identify coping skills to deal with stressful situations.  Intervention: Worksheet   Activity: Stress Exploration.  Patients were to identify daily hassles, major life changes and life circumstances and rate them from least stressful to most stressful. Patients would then identify coping skills they use for daily uplifts, healthy coping strategies and protective factors. Ratings for stressors can be found in parenthesis.   Education:  Stress Management, Discharge Planning.   Education Outcome: Acknowledges Education  Clinical Observations/Feedback: Pt did not attend group.    Kathy Velazquez, LRT/CTRS         Kathy Velazquez 12/31/2020 12:01 PM 

## 2021-01-01 LAB — T3 UPTAKE: T3 Uptake Ratio: 24 % (ref 24–39)

## 2021-01-01 NOTE — Progress Notes (Signed)
Recreation Therapy Notes  Date: 2.15.22 Time: 0950 Location: 500 Hall Dayroom   Group Topic: Leisure Education  Goal Area(s) Addresses:  Patient will identify positive leisure activities for use post discharge. Patient will identify at skills used to complete activity. Patient will work effectively work with peers to reach shared goal.  Intervention: Beach ball, chairs, music  Activity: Keep It Going Volleyball.  In a circle, patients will toss a beach ball back and forth to each other.  Patients are to keep the ball in rotation without it coming to a full stop.  LRT will keep count of the number hits made on the ball.  Patients can bounce the ball off the floor or wall as long as it does not stop.  If the ball stops, the count will start over from the beginning.  Education:  Leisure Education, Communication, Teamwork, Discharge Planning  Education Outcome: Acknowledges education/In group clarification offered/Needs additional education.   Clinical Observations/Feedback: Pt did not attend group .    Dayan Kreis, LRT/CTRS         Trevion Hoben A 01/01/2021 12:12 PM 

## 2021-01-01 NOTE — Progress Notes (Signed)
Progress note    01/01/21 0815  Psych Admission Type (Psych Patients Only)  Admission Status Involuntary  Psychosocial Assessment  Patient Complaints Anxiety  Eye Contact Avertive;Brief  Facial Expression Anxious;Pensive  Affect Anxious;Preoccupied  Speech Logical/coherent;Soft  Interaction Cautious;Forwards little;Guarded;Minimal  Motor Activity Fidgety  Appearance/Hygiene Unremarkable  Behavior Characteristics Cooperative;Appropriate to situation;Anxious;Fidgety;Guarded  Mood Anxious;Suspicious;Preoccupied;Pleasant  Thought Administrator, sports thinking  Content Paranoia  Delusions Paranoid  Perception WDL  Hallucination None reported or observed  Judgment Poor  Confusion None  Danger to Self  Current suicidal ideation? Denies  Danger to Others  Danger to Others None reported or observed
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
## 2021-01-01 NOTE — Plan of Care (Signed)

## 2021-01-02 MED ORDER — RISPERIDONE ER 120 MG ~~LOC~~ PRSY
120.0000 mg | PREFILLED_SYRINGE | SUBCUTANEOUS | Status: DC
  Administered 2021-01-02: 120 mg via SUBCUTANEOUS

## 2021-01-02 MED ORDER — PERSERIS 120 MG ~~LOC~~ PRSY: 120.0000 mg | PREFILLED_SYRINGE | SUBCUTANEOUS | 0 refills | Status: AC

## 2021-01-02 MED ORDER — RISPERIDONE ER 120 MG ~~LOC~~ PRSY
120.0000 mg | PREFILLED_SYRINGE | SUBCUTANEOUS | Status: DC
  Filled 2021-01-02: qty 120

## 2021-01-02 MED ORDER — HYDROXYZINE HCL 25 MG PO TABS: 25.0000 mg | ORAL_TABLET | Freq: Three times a day (TID) | ORAL | 0 refills | Status: DC | PRN

## 2021-01-02 NOTE — Progress Notes (Signed)
Recreation Therapy Notes  INPATIENT RECREATION TR PLAN  Patient Details Name: GIZZELLE LACOMB MRN: [id number] DOB: February 18, 1986 Today's Date: 01/02/2021  Rec Therapy Plan Is patient appropriate for Therapeutic Recreation?: Yes Treatment times per week: about 3 days Estimated Length of Stay: 5-7 days TR Treatment/Interventions: Group participation (Comment)  Discharge Criteria Pt will be discharged from therapy if:: Discharged Treatment plan/goals/alternatives discussed and agreed upon by:: Patient/family  Discharge Summary Short term goals set: See patient care plan Short term goals met: Not met Reason goals not met: Pt did not attend group sessions. Therapeutic equipment acquired: N/A Reason patient discharged from therapy: Discharge from hospital Pt/family agrees with progress & goals achieved: Yes Date patient discharged from therapy: 01/02/21    Victorino Sparrow, LRT/CTRS  Ria Comment, Deshonda Cryderman A 01/02/2021, 12:06 PM

## 2021-01-02 NOTE — Plan of Care (Signed)
Discharge note  Patient verbalizes readiness for discharge. Follow up plan explained, AVS, Transition record and SRA given. Prescriptions and teaching provided. Belongings returned and signed for. Suicide safety plan completed and signed. Patient verbalizes understanding. Patient denies SI/HI and assures this Clinical research associate they will seek assistance should that change. Patient discharged to lobby where pt was waiting for their ride.  Problem: Education: Goal: Ability to state activities that reduce stress will improve Outcome: Adequate for Discharge   Problem: Coping: Goal: Ability to identify and develop effective coping behavior will improve Outcome: Adequate for Discharge   Problem: Self-Concept: Goal: Ability to identify factors that promote anxiety will improve Outcome: Adequate for Discharge Goal: Level of anxiety will decrease Outcome: Adequate for Discharge Goal: Ability to modify response to factors that promote anxiety will improve Outcome: Adequate for Discharge   Problem: Education: Goal: Knowledge of [facility] Education information/materials will improve Outcome: Adequate for Discharge Goal: Emotional status will improve Outcome: Adequate for Discharge Goal: Mental status will improve Outcome: Adequate for Discharge Goal: Verbalization of understanding the information provided will improve Outcome: Adequate for Discharge   Problem: Activity: Goal: Interest or engagement in activities will improve Outcome: Adequate for Discharge Goal: Sleeping patterns will improve Outcome: Adequate for Discharge   Problem: Coping: Goal: Ability to verbalize frustrations and anger appropriately will improve Outcome: Adequate for Discharge Goal: Ability to demonstrate self-control will improve Outcome: Adequate for Discharge   Problem: Health Behavior/Discharge Planning: Goal: Identification of resources available to assist in meeting health care needs will improve Outcome:  Adequate for Discharge Goal: Compliance with treatment plan for underlying cause of condition will improve Outcome: Adequate for Discharge   Problem: Physical Regulation: Goal: Ability to maintain clinical measurements within normal limits will improve Outcome: Adequate for Discharge   Problem: Safety: Goal: Periods of time without injury will increase 01/02/2021 1402 by Raylene Miyamoto, RN Outcome: Adequate for Discharge 01/02/2021 0837 by Raylene Miyamoto, RN Outcome: Progressing   Problem: Education: Goal: Ability to verbalize precipitating factors for violent behavior will improve 01/02/2021 1402 by Raylene Miyamoto, RN Outcome: Adequate for Discharge 01/02/2021 0837 by Raylene Miyamoto, RN Outcome: Progressing   Problem: Coping: Goal: Ability to verbalize frustrations and anger appropriately will improve 01/02/2021 1402 by Raylene Miyamoto, RN Outcome: Adequate for Discharge 01/02/2021 1245 by Raylene Miyamoto, RN Outcome: Progressing   Problem: Health Behavior/Discharge Planning: Goal: Ability to implement measures to prevent violent behavior in the future will improve Outcome: Adequate for Discharge   Problem: Safety: Goal: Ability to demonstrate self-control will improve Outcome: Adequate for Discharge Goal: Ability to redirect hostility and anger into socially appropriate behaviors will improve Outcome: Adequate for Discharge   Problem: Activity: Goal: Will identify at least one activity in which they can participate Outcome: Adequate for Discharge   Problem: Coping: Goal: Ability to identify and develop effective coping behavior will improve Outcome: Adequate for Discharge Goal: Ability to interact with others will improve Outcome: Adequate for Discharge Goal: Demonstration of participation in decision-making regarding own care will improve Outcome: Adequate for Discharge Goal: Ability to use eye contact when communicating with others will  improve Outcome: Adequate for Discharge   Problem: Health Behavior/Discharge Planning: Goal: Identification of resources available to assist in meeting health care needs will improve Outcome: Adequate for Discharge   Problem: Self-Concept: Goal: Will verbalize positive feelings about self Outcome: Adequate for Discharge

## 2021-01-02 NOTE — Plan of Care (Signed)
Pt did not attend recreation therapy group session.   Caroll Rancher, LRT/CTRS

## 2021-01-02 NOTE — Progress Notes (Signed)
Adult Psychoeducational Group Note  Date:  01/02/2021 Time:  12:12 AM  Group Topic/Focus:  Wrap-Up Group:   The focus of this group is to help patients review their daily goal of treatment and discuss progress on daily workbooks.  Participation Level:  Did Not Attend  Participation Quality:  Did Not Attend  Affect:  Did Not Attend  Cognitive:  Did Not Attend  Insight: None  Engagement in Group:  Did Not Attend  Modes of Intervention:  Did Not Attend  Additional Comments:  Pt did not attend evening wrap up group tonight.  Felipa Furnace 01/02/2021, 12:12 AM

## 2021-01-02 NOTE — Progress Notes (Signed)
Recreation Therapy Notes  Date: 2.16.22 Time: 1010 Location: 500 Hall Dayroom  Group Topic: Anxiety  Goal Area(s) Addresses:  Patient will identify what anxiety is. Patient will identify what causes anxiety. Patient will identify coping skills for dealing with anxiety.  Intervention: Worksheet  Activity: Introduction to Anxiety.  Patient will identify triggers to anxiety, physical symptoms experienced when anxious, thoughts that go through the mind when anxious and coping skills used to deal with anxiety.  Education: Anxiety, Discharge Planning  Education Outcome: Acknowledges understanding/In group clarification offered/Needs additional education.   Clinical Observations/Feedback: Pt did not attend group session.    Caroll Rancher, LRT/CTRS         Caroll Rancher A 01/02/2021 12:02 PM
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
## 2021-01-02 NOTE — Plan of Care (Signed)
  Problem: Safety: Goal: Periods of time without injury will increase Outcome: Progressing   Problem: Education: Goal: Ability to verbalize precipitating factors for violent behavior will improve Outcome: Progressing   Problem: Coping: Goal: Ability to verbalize frustrations and anger appropriately will improve Outcome: Progressing

## 2021-01-02 NOTE — Progress Notes (Signed)
  [facility] Adult Case Management Discharge Plan :  Will you be returning to the same living situation after discharge:  Yes,  to home At discharge, do you have transportation home?: No. Safe Transport to be arranged Do you have the ability to pay for your medications: No. Samples to be provided at discharge   Release of information consent forms completed and in the chart;  Patient's signature needed at discharge.  Patient to Follow up at:  Follow-up Information    [facility]. Go on 01/16/2021.   Specialty: Urgent Care Why: A walk-in appointment has been scheduled for you on Wednesday 01/17/2020 at 7:45am.   Contact information: [address] [place] [address] [phone]              Next level of care provider has access to [facility] Link:yes  Safety Planning and Suicide Prevention discussed: Yes,  with patient   Have you used any form of tobacco in the last 30 days? (Cigarettes, Smokeless Tobacco, Cigars, and/or Pipes): No  Has patient been referred to the Quitline?: N/A patient is not a smoker  Patient has been referred for addiction treatment: N/A  Otelia Santee, LCSW 01/02/2021, 10:24 AM

## 2021-01-02 NOTE — Tx Team (Signed)
Interdisciplinary Treatment and Diagnostic Plan Update  01/02/2021 Time of Session: 9:00am Kathy Velazquez MRN: [id number]  Principal Diagnosis: MDD (major depressive disorder), recurrent, severe, with psychosis (HCC)  Secondary Diagnoses: Principal Problem:   MDD (major depressive disorder), recurrent, severe, with psychosis (HCC)   Current Medications:  Current Facility-Administered Medications  Medication Dose Route Frequency Provider Last Rate Last Admin  . acetaminophen (TYLENOL) tablet 650 mg  650 mg Oral Q6H PRN Antonieta Pert, MD      . alum & mag hydroxide-simeth (MAALOX/MYLANTA) 200-200-20 MG/5ML suspension 30 mL  30 mL Oral Q4H PRN Antonieta Pert, MD      . feeding supplement (ENSURE ENLIVE / ENSURE PLUS) liquid 237 mL  237 mL Oral BID BM Antonieta Pert, MD   237 mL at 01/01/21 1432  . hydrOXYzine (ATARAX/VISTARIL) tablet 25 mg  25 mg Oral TID PRN Antonieta Pert, MD      . magnesium hydroxide (MILK OF MAGNESIA) suspension 30 mL  30 mL Oral Daily PRN Antonieta Pert, MD      . multivitamin with minerals tablet 1 tablet  1 tablet Oral Daily Antonieta Pert, MD   1 tablet at 01/02/21 0743  . risperiDONE ER PRSY 120 mg  120 mg Subcutaneous Q28 days Cristofano, Paul A, MD      . thiamine (B-1) injection 100 mg  100 mg Intramuscular Once Antonieta Pert, MD       PTA Medications: Medications Prior to Admission  Medication Sig Dispense Refill Last Dose  . ARIPiprazole (ABILIFY) 2 MG tablet Take 1 tablet (2 mg total) by mouth daily.     Marland Kitchen FLUoxetine (PROZAC) 10 MG capsule Take 1 capsule (10 mg total) by mouth daily.  3     Patient Stressors: Marital or family conflict Medication change or noncompliance Substance abuse  Patient Strengths: General fund of knowledge Physical Health  Treatment Modalities: Medication Management, Group therapy, Case management,  1 to 1 session with clinician, Psychoeducation, Recreational therapy.   Physician Treatment  Plan for Primary Diagnosis: MDD (major depressive disorder), recurrent, severe, with psychosis (HCC) Long Term Goal(s): Improvement in symptoms so as ready for discharge Improvement in symptoms so as ready for discharge   Short Term Goals: Ability to identify changes in lifestyle to reduce recurrence of condition will improve Ability to verbalize feelings will improve Ability to demonstrate self-control will improve Ability to identify and develop effective coping behaviors will improve Ability to maintain clinical measurements within normal limits will improve Ability to identify triggers associated with substance abuse/mental health issues will improve Ability to identify changes in lifestyle to reduce recurrence of condition will improve Ability to verbalize feelings will improve Ability to demonstrate self-control will improve Ability to identify and develop effective coping behaviors will improve Ability to maintain clinical measurements within normal limits will improve Ability to identify triggers associated with substance abuse/mental health issues will improve  Medication Management: Evaluate patient's response, side effects, and tolerance of medication regimen.  Therapeutic Interventions: 1 to 1 sessions, Unit Group sessions and Medication administration.  Evaluation of Outcomes: Adequate for Discharge  Physician Treatment Plan for Secondary Diagnosis: Principal Problem:   MDD (major depressive disorder), recurrent, severe, with psychosis (HCC)  Long Term Goal(s): Improvement in symptoms so as ready for discharge Improvement in symptoms so as ready for discharge   Short Term Goals: Ability to identify changes in lifestyle to reduce recurrence of condition will improve Ability to verbalize feelings will improve Ability to  demonstrate self-control will improve Ability to identify and develop effective coping behaviors will improve Ability to maintain clinical measurements  within normal limits will improve Ability to identify triggers associated with substance abuse/mental health issues will improve Ability to identify changes in lifestyle to reduce recurrence of condition will improve Ability to verbalize feelings will improve Ability to demonstrate self-control will improve Ability to identify and develop effective coping behaviors will improve Ability to maintain clinical measurements within normal limits will improve Ability to identify triggers associated with substance abuse/mental health issues will improve     Medication Management: Evaluate patient's response, side effects, and tolerance of medication regimen.  Therapeutic Interventions: 1 to 1 sessions, Unit Group sessions and Medication administration.  Evaluation of Outcomes: Adequate for Discharge   RN Treatment Plan for Primary Diagnosis: MDD (major depressive disorder), recurrent, severe, with psychosis (HCC) Long Term Goal(s): Knowledge of disease and therapeutic regimen to maintain health will improve  Short Term Goals: Ability to remain free from injury will improve, Ability to verbalize frustration and anger appropriately will improve, Ability to participate in decision making will improve, Ability to verbalize feelings will improve, Ability to identify and develop effective coping behaviors will improve and Compliance with prescribed medications will improve  Medication Management: RN will administer medications as ordered by provider, will assess and evaluate patient's response and provide education to patient for prescribed medication. RN will report any adverse and/or side effects to prescribing provider.  Therapeutic Interventions: 1 on 1 counseling sessions, Psychoeducation, Medication administration, Evaluate responses to treatment, Monitor vital signs and CBGs as ordered, Perform/monitor CIWA, COWS, AIMS and Fall Risk screenings as ordered, Perform wound care treatments as  ordered.  Evaluation of Outcomes: Adequate for Discharge   LCSW Treatment Plan for Primary Diagnosis: MDD (major depressive disorder), recurrent, severe, with psychosis (HCC) Long Term Goal(s): Safe transition to appropriate next level of care at discharge, Engage patient in therapeutic group addressing interpersonal concerns.  Short Term Goals: Engage patient in aftercare planning with referrals and resources, Increase social support, Increase ability to appropriately verbalize feelings, Identify triggers associated with mental health/substance abuse issues and Increase skills for wellness and recovery  Therapeutic Interventions: Assess for all discharge needs, 1 to 1 time with Social worker, Explore available resources and support systems, Assess for adequacy in community support network, Educate family and significant other(s) on suicide prevention, Complete Psychosocial Assessment, Interpersonal group therapy.  Evaluation of Outcomes: Adequate for Discharge   Progress in Treatment: Attending groups: No. Participating in groups: No. Taking medication as prescribed: Yes. Toleration medication: Yes. Family/Significant other contact made: No, will contact:  patient declined consents  Patient understands diagnosis: No. Discussing patient identified problems/goals with staff: No. Medical problems stabilized or resolved: Yes. Denies suicidal/homicidal ideation: Yes. Issues/concerns per patient self-inventory: No.  New problem(s) identified: No, Describe:  none  New Short Term/Long Term Goal(s): medication stabilization, elimination of SI thoughts, development of comprehensive mental wellness plan.   Patient Goals:  "To lessen my anxiety"  Discharge Plan or Barriers: Patient has agreed to medication management through the [facility] at discharge.  Reason for Continuation of Hospitalization: Delusions  Medication stabilization  Estimated Length of Stay: 1-3 days  Attendees: Patient:   01/02/2021   Physician:  01/02/2021  Nursing:  01/02/2021   RN Care Manager: 01/02/2021  Social Worker: Ruthann Cancer, LCSW 01/02/2021   Recreational Therapist:  01/02/2021   Other:  01/02/2021   Other:  01/02/2021   Other: 01/02/2021    Scribe for Treatment Team:  Otelia Santee, LCSW 01/02/2021 10:32 AM
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
## 2021-07-06 ENCOUNTER — Other Ambulatory Visit: Payer: Self-pay

## 2021-07-06 ENCOUNTER — Ambulatory Visit (HOSPITAL_COMMUNITY)
Admission: EM | Admit: 2021-07-06 | Discharge: 2021-07-06 | Disposition: A | Discharge status: Home / Self Care | Payer: No Payment, Other | Attending: Behavioral Health | Admitting: Behavioral Health

## 2021-07-06 DIAGNOSIS — F333 Major depressive disorder, recurrent, severe with psychotic symptoms: Secondary | ICD-10-CM | POA: Insufficient documentation

## 2021-07-06 LAB — POCT URINE DRUG SCREEN - MANUAL ENTRY (I-SCREEN)
POC Amphetamine UR: NOT DETECTED
POC Buprenorphine (BUP): NOT DETECTED
POC Cocaine UR: NOT DETECTED
POC Marijuana UR: NOT DETECTED
POC Methadone UR: NOT DETECTED
POC Methamphetamine UR: NOT DETECTED
POC Morphine: NOT DETECTED
POC Oxazepam (BZO): POSITIVE — AB
POC Oxycodone UR: NOT DETECTED
POC Secobarbital (BAR): NOT DETECTED

## 2021-07-06 LAB — POCT PREGNANCY, URINE: Preg Test, Ur: NEGATIVE

## 2021-07-06 MED ORDER — RISPERIDONE 1 MG PO TABS: 1.0000 mg | ORAL_TABLET | Freq: Two times a day (BID) | ORAL | 0 refills | Status: AC

## 2021-07-06 MED ORDER — HYDROXYZINE HCL 25 MG PO TABS: 25.0000 mg | ORAL_TABLET | Freq: Three times a day (TID) | ORAL | 0 refills | Status: AC | PRN

## 2021-07-06 NOTE — Discharge Instructions (Addendum)

## 2021-07-06 NOTE — ED Provider Notes (Signed)
Behavioral Health Urgent Care Medical Screening Exam  Patient Name: Kathy Velazquez MRN: [id number] Date of Evaluation: 07/06/21 Chief Complaint:  "thinking people around me are talking about but no other voices."  Diagnosis:  Final diagnoses:  MDD (major depressive disorder), recurrent, severe, with psychosis ([facility])    History of Present illness: Kathy Velazquez is a 35 y.o. female patient presents to the [facility] voluntarily as a walk-in accompanied by her mother with a chief complaint of "thinking people around her are talking about her but no other voices." Patient has a psychiatric hx of MDD with psychosis. One prior hospitalization in Feb, 2022.  Patient seen and examined face to face by this provider and chart reviewed. On evaluation, the patient is alert and oriented x4. Her thought process is logical and speech is coherent. Her mood is dysphoric and affect is congruent. She has minimum eye contact. She does not appear to be in distress. She denies suicidal ideations. She denies homicidal ideations. She denies auditory and visual hallucinations. She does not appear to be responding to internal or external stimuli. She reports feeling paranoid like people at work or talking about her, following her around, and intentionally doing things to upset her. She states that she works as a Pharmacologist in [employer] and has difficulty functioning. She states that it's to the point where she might quit her job. She states that at work she worries about how close people are to her, and focuses on what people are saying because she believes people are talking about her. She states that she stopped answering the telephones because she have thoughts that people are trying to send her messages through the phone. She identifies a possible trigger to feeling paranoid from an incident that happened a month ago where a female coworker hand rubbed up against her back. She  denies a history of sexual, physical, and emotional abuse. She denies using illicit drugs including marijuana. She reports drinking alcohol socially over the weekend. She states that she is wanting to get back on her medications for her thoughts. Per chart review, the patient was prescribed Perseris 120 mg IM and Vistaril 25 mg TID PRN for anxiety  during her hospitalization at [facility] in February, 2022. We discussed the patient staying here at the facility based crisis to restart her medication and to receive the long-acting injectable. She declined stating here at the facility based crisis and is requesting a prescription. We discussed receiving a 14-day prescription for Risperdal 1 mg twice daily for mood stabilization and Vistaril 25 mg TID as needed for anxiety, with a plan for her to follow-up at the [facility] outpatient services for medication management and therapy. Patient agrees to the stated plan. Patient agrees to provide a urine pregnancy test, UDS and EKG at this time. Patient reports feeling safe to discharge and states that she lives with her parents. She refuses to give verbal consent for this provider to obtain collateral information from her mother.   Labs: EKG: QTC 424 Preg: neg UDS pos for benzo's   Patient denies a family hx of mental illness.   Psychiatric Specialty Exam  Presentation  General Appearance:Appropriate for Environment  Eye Contact:Minimal  Speech:Clear and Coherent  Speech Volume:Normal  Handedness:Right   Mood and Affect  Mood:Dysphoric  Affect:Congruent   Thought Process  Thought Processes:Coherent; Goal Directed  Descriptions of Associations:Intact  Orientation:Full (Time, Place and Person)  Thought Content:WDL  Diagnosis of Schizophrenia  or Schizoaffective disorder in past: No   Hallucinations:None  Ideas of Reference:Paranoia  Suicidal Thoughts:No  Homicidal Thoughts:No   Sensorium  Memory:Immediate Fair;  Recent Fair; Remote Good  Judgment:Intact  Insight:Present   Executive Functions  Concentration:Fair  Attention Span:Fair  Recall:Fair  Fund of Knowledge:Fair  Language:Fair   Psychomotor Activity  Psychomotor Activity:Normal   Assets  Assets:Desire for Improvement; Manufacturing systems engineer; Financial Resources/Insurance; Housing; Leisure Time; Physical Health; Social Support   Sleep  Sleep:Poor  Number of hours: 4   No data recorded  Physical Exam: Physical Exam HENT:     Head: Normocephalic.     Nose: Nose normal.  Eyes:     Conjunctiva/sclera: Conjunctivae normal.  Cardiovascular:     Rate and Rhythm: Normal rate.  Pulmonary:     Effort: Pulmonary effort is normal.  Musculoskeletal:        General: Normal range of motion.     Cervical back: Normal range of motion.  Neurological:     Mental Status: She is oriented to person, place, and time.   Review of Systems  Constitutional: Negative.   HENT: Negative.    Eyes: Negative.   Respiratory: Negative.    Cardiovascular: Negative.   Gastrointestinal: Negative.   Genitourinary: Negative.   Musculoskeletal: Negative.   Skin: Negative.   Neurological: Negative.   Endo/Heme/Allergies: Negative.   Psychiatric/Behavioral:  Negative for hallucinations and suicidal ideas.   Blood pressure 123/71, pulse 88, temperature 98.6 F (37 C), temperature source Oral, resp. rate 18, SpO2 100 %. There is no height or weight on file to calculate BMI.  Musculoskeletal: Strength & Muscle Tone: within normal limits Gait & Station: normal Patient leans: N/A   [facility] MSE Discharge Disposition for Follow up and Recommendations: Based on my evaluation the patient does not appear to have an emergency medical condition and can be discharged with resources and follow up care in outpatient services for Medication Management, Individual Therapy, and Group Therapy   Follow up at the [facility] for medication management and therapy.    Take all of you medications as prescribed by your mental healthcare provider.  Report any adverse effects and reactions from your medications to your outpatient provider promptly. Do not engage in alcohol and or illegal drug use while on prescription medicines.  Keep all scheduled appointments.This is to ensure that you are getting refills on time and to avoid any interruption in your medication.  If you are unable to keep an appointment call to reschedule.  Be sure to follow up with resources and follow ups given.  In the event of worsening symptoms call the crisis hotline, 911, and or go to the nearest emergency department for appropriate evaluation and treatment of symptoms. Follow-up with your primary care provider for your medical issues, concerns and or health care needs.     Layla Barter, NP 07/06/2021, 6:26 PM

## 2021-07-06 NOTE — ED Notes (Signed)
Patient given AVS and information reviewed.  All questions answered.  Patient discharged in stable condition; no acute distress noted.

## 2021-07-06 NOTE — Progress Notes (Signed)
   07/06/21 1702  [facility] Triage Screening (Walk-ins at [facility] only)  How Did You Hear About [place]? Family/Friend  What Is the Reason for Your Visit/Call Today? ROUTINE. Pt denies SI, HI, NSSH, VH. Stated at times she thinks people around her are talking about her but no other "voices" etc. Stated she came in because she has been unable to perform at work. Stated she goes in but has to leave. She is a [employer] farm manager. 1 prior hospitalization- Feb 2022. IVC'd for aggressive Behavior. Mom is with her but she would not let mom talk to me. Stated she thinks she needs medication.  How Long Has This Been Causing You Problems? 1 wk - 1 month  Have You Recently Had Any Thoughts About Hurting Yourself? No  Are You Planning to Commit Suicide/Harm Yourself At This time? No  Have you Recently Had Thoughts About Hurting Someone Karolee Ohs? No  Are You Planning To Harm Someone At This Time? No  Are you currently experiencing any auditory, visual or other hallucinations? No  Have You Used Any Alcohol or Drugs in the Past 24 Hours? No  Do you have any current medical co-morbidities that require immediate attention? No  What Do You Feel Would Help You the Most Today? Medication(s)  If access to [facility] was not available, would you have sought care in the Emergency Department? Yes  Determination of Need Routine (7 days)   Corrie Dandy T. Jimmye Norman, MS, [facility], [facility] Triage Specialist [facility]
